# Patient Record
Sex: Female | Born: 1945 | Race: White | Hispanic: No | Marital: Married | Smoking: Former smoker
Health system: Southern US, Community
[De-identification: ages and names within clinical notes are randomized; demographics above are authoritative.]

## PROBLEM LIST (undated history)

## (undated) DIAGNOSIS — J302 Other seasonal allergic rhinitis: Secondary | ICD-10-CM

## (undated) DIAGNOSIS — T7840XA Allergy, unspecified, initial encounter: Secondary | ICD-10-CM

## (undated) DIAGNOSIS — Z86010 Personal history of colonic polyps: Secondary | ICD-10-CM

## (undated) DIAGNOSIS — M21619 Bunion of unspecified foot: Secondary | ICD-10-CM

## (undated) DIAGNOSIS — E785 Hyperlipidemia, unspecified: Secondary | ICD-10-CM

## (undated) DIAGNOSIS — M810 Age-related osteoporosis without current pathological fracture: Secondary | ICD-10-CM

## (undated) DIAGNOSIS — I491 Atrial premature depolarization: Secondary | ICD-10-CM

## (undated) DIAGNOSIS — B029 Zoster without complications: Secondary | ICD-10-CM

## (undated) DIAGNOSIS — F419 Anxiety disorder, unspecified: Secondary | ICD-10-CM

## (undated) DIAGNOSIS — K52831 Collagenous colitis: Secondary | ICD-10-CM

## (undated) DIAGNOSIS — K219 Gastro-esophageal reflux disease without esophagitis: Secondary | ICD-10-CM

## (undated) HISTORY — DX: Gastro-esophageal reflux disease without esophagitis: K21.9

## (undated) HISTORY — DX: Collagenous colitis: K52.831

## (undated) HISTORY — PX: WISDOM TOOTH EXTRACTION: SHX21

## (undated) HISTORY — PX: COLONOSCOPY: SHX174

## (undated) HISTORY — DX: Personal history of colonic polyps: Z86.010

## (undated) HISTORY — DX: Other seasonal allergic rhinitis: J30.2

## (undated) HISTORY — DX: Allergy, unspecified, initial encounter: T78.40XA

## (undated) HISTORY — DX: Atrial premature depolarization: I49.1

## (undated) HISTORY — DX: Hyperlipidemia, unspecified: E78.5

## (undated) HISTORY — DX: Age-related osteoporosis without current pathological fracture: M81.0

## (undated) HISTORY — DX: Anxiety disorder, unspecified: F41.9

## (undated) HISTORY — DX: Zoster without complications: B02.9

## (undated) HISTORY — DX: Bunion of unspecified foot: M21.619

---

## 1997-11-27 ENCOUNTER — Other Ambulatory Visit: Admission: RE | Admit: 1997-11-27 | Discharge: 1997-11-27 | Payer: Self-pay | Admitting: Internal Medicine

## 1998-08-03 ENCOUNTER — Other Ambulatory Visit: Admission: RE | Admit: 1998-08-03 | Discharge: 1998-08-03 | Payer: Self-pay | Admitting: Obstetrics and Gynecology

## 1999-09-23 ENCOUNTER — Other Ambulatory Visit: Admission: RE | Admit: 1999-09-23 | Discharge: 1999-09-23 | Payer: Self-pay | Admitting: Obstetrics and Gynecology

## 2000-11-09 ENCOUNTER — Other Ambulatory Visit: Admission: RE | Admit: 2000-11-09 | Discharge: 2000-11-09 | Payer: Self-pay | Admitting: Obstetrics and Gynecology

## 2002-02-12 ENCOUNTER — Ambulatory Visit (HOSPITAL_COMMUNITY): Admission: RE | Admit: 2002-02-12 | Discharge: 2002-02-12 | Payer: Self-pay | Admitting: Gastroenterology

## 2002-03-06 ENCOUNTER — Other Ambulatory Visit: Admission: RE | Admit: 2002-03-06 | Discharge: 2002-03-06 | Payer: Self-pay | Admitting: Obstetrics and Gynecology

## 2003-03-28 ENCOUNTER — Other Ambulatory Visit: Admission: RE | Admit: 2003-03-28 | Discharge: 2003-03-28 | Payer: Self-pay | Admitting: Obstetrics and Gynecology

## 2004-07-28 ENCOUNTER — Other Ambulatory Visit: Admission: RE | Admit: 2004-07-28 | Discharge: 2004-07-28 | Payer: Self-pay | Admitting: *Deleted

## 2005-09-29 ENCOUNTER — Other Ambulatory Visit: Admission: RE | Admit: 2005-09-29 | Discharge: 2005-09-29 | Payer: Self-pay | Admitting: Obstetrics & Gynecology

## 2006-01-20 ENCOUNTER — Encounter: Admission: RE | Admit: 2006-01-20 | Discharge: 2006-01-20 | Payer: Self-pay | Admitting: Internal Medicine

## 2006-03-27 ENCOUNTER — Ambulatory Visit: Payer: Self-pay | Admitting: Internal Medicine

## 2006-03-29 DIAGNOSIS — Z860100 Personal history of colon polyps, unspecified: Secondary | ICD-10-CM

## 2006-03-29 DIAGNOSIS — K52831 Collagenous colitis: Secondary | ICD-10-CM

## 2006-03-29 HISTORY — DX: Personal history of colonic polyps: Z86.010

## 2006-03-29 HISTORY — DX: Collagenous colitis: K52.831

## 2006-03-29 HISTORY — DX: Personal history of colon polyps, unspecified: Z86.0100

## 2006-04-03 ENCOUNTER — Ambulatory Visit: Payer: Self-pay | Admitting: Internal Medicine

## 2006-04-03 ENCOUNTER — Observation Stay (HOSPITAL_COMMUNITY): Admission: AD | Admit: 2006-04-03 | Discharge: 2006-04-04 | Payer: Self-pay | Admitting: Internal Medicine

## 2006-04-07 ENCOUNTER — Ambulatory Visit: Payer: Self-pay | Admitting: Internal Medicine

## 2006-04-07 ENCOUNTER — Ambulatory Visit: Payer: Self-pay

## 2006-04-10 ENCOUNTER — Ambulatory Visit: Payer: Self-pay | Admitting: Internal Medicine

## 2006-04-11 ENCOUNTER — Ambulatory Visit: Payer: Self-pay

## 2006-04-11 ENCOUNTER — Encounter: Payer: Self-pay | Admitting: Cardiology

## 2006-04-18 ENCOUNTER — Ambulatory Visit: Payer: Self-pay | Admitting: Internal Medicine

## 2006-06-16 ENCOUNTER — Ambulatory Visit: Payer: Self-pay | Admitting: Cardiology

## 2006-11-23 ENCOUNTER — Other Ambulatory Visit: Admission: RE | Admit: 2006-11-23 | Discharge: 2006-11-23 | Payer: Self-pay | Admitting: Obstetrics & Gynecology

## 2007-06-07 ENCOUNTER — Ambulatory Visit: Payer: Self-pay | Admitting: Cardiology

## 2008-02-05 ENCOUNTER — Other Ambulatory Visit: Admission: RE | Admit: 2008-02-05 | Discharge: 2008-02-05 | Payer: Self-pay | Admitting: Obstetrics and Gynecology

## 2008-06-20 ENCOUNTER — Ambulatory Visit: Payer: Self-pay | Admitting: Cardiology

## 2009-04-09 ENCOUNTER — Encounter (INDEPENDENT_AMBULATORY_CARE_PROVIDER_SITE_OTHER): Payer: Self-pay | Admitting: *Deleted

## 2009-05-29 DIAGNOSIS — M810 Age-related osteoporosis without current pathological fracture: Secondary | ICD-10-CM

## 2009-05-29 HISTORY — DX: Age-related osteoporosis without current pathological fracture: M81.0

## 2009-06-01 ENCOUNTER — Encounter: Payer: Self-pay | Admitting: Cardiology

## 2009-06-22 DIAGNOSIS — I471 Supraventricular tachycardia, unspecified: Secondary | ICD-10-CM | POA: Insufficient documentation

## 2009-06-22 DIAGNOSIS — E785 Hyperlipidemia, unspecified: Secondary | ICD-10-CM | POA: Insufficient documentation

## 2009-06-22 DIAGNOSIS — K219 Gastro-esophageal reflux disease without esophagitis: Secondary | ICD-10-CM | POA: Insufficient documentation

## 2009-06-22 DIAGNOSIS — Z8679 Personal history of other diseases of the circulatory system: Secondary | ICD-10-CM | POA: Insufficient documentation

## 2009-06-23 ENCOUNTER — Encounter (INDEPENDENT_AMBULATORY_CARE_PROVIDER_SITE_OTHER): Payer: Self-pay | Admitting: *Deleted

## 2009-06-26 ENCOUNTER — Ambulatory Visit: Payer: Self-pay | Admitting: Cardiology

## 2009-10-06 ENCOUNTER — Telehealth: Payer: Self-pay | Admitting: Cardiology

## 2009-10-27 ENCOUNTER — Telehealth: Payer: Self-pay | Admitting: Cardiology

## 2009-10-28 ENCOUNTER — Encounter: Payer: Self-pay | Admitting: Physician Assistant

## 2009-10-28 ENCOUNTER — Ambulatory Visit: Payer: Self-pay | Admitting: Cardiology

## 2009-10-28 DIAGNOSIS — R079 Chest pain, unspecified: Secondary | ICD-10-CM | POA: Insufficient documentation

## 2009-11-03 ENCOUNTER — Telehealth (INDEPENDENT_AMBULATORY_CARE_PROVIDER_SITE_OTHER): Payer: Self-pay | Admitting: *Deleted

## 2009-11-04 ENCOUNTER — Ambulatory Visit: Payer: Self-pay

## 2009-11-04 ENCOUNTER — Encounter (HOSPITAL_COMMUNITY): Admission: RE | Admit: 2009-11-04 | Discharge: 2009-12-29 | Payer: Self-pay | Admitting: Cardiology

## 2009-11-04 ENCOUNTER — Ambulatory Visit: Payer: Self-pay | Admitting: Internal Medicine

## 2009-11-05 ENCOUNTER — Telehealth: Payer: Self-pay | Admitting: Cardiology

## 2009-11-10 ENCOUNTER — Ambulatory Visit: Payer: Self-pay | Admitting: Cardiology

## 2010-06-09 ENCOUNTER — Encounter: Payer: Self-pay | Admitting: Cardiology

## 2010-08-29 HISTORY — PX: FACIAL COSMETIC SURGERY: SHX629

## 2010-09-28 NOTE — Progress Notes (Signed)
Summary: rx metoprolol tart   Phone Note Refill Request Message from:  Patient on October 06, 2009 9:13 AM  Refills Requested: Medication #1:  METOPROLOL TARTRATE 25 MG TABS 1 tab two times a day   [facility] [address]    Method Requested: Telephone to Pharmacy Initial call taken by: Migdalia Dk,  October 06, 2009 9:14 AM Caller: Patient    Prescriptions: METOPROLOL TARTRATE 25 MG TABS (METOPROLOL TARTRATE) 1 tab two times a day  #60 x 11   Entered by:   Danielle Rankin, CMA   Authorized by:   Gaylord Shih, MD, [facility]   Signed by:   Danielle Rankin, CMA on 10/06/2009   Method used:   [facility]        [facility]  [phone]* (retail)       [address] [place], [place]  [address]       Ph: [phone] or [phone]       Fax: [phone]   RxID:   [id number]

## 2010-09-28 NOTE — Assessment & Plan Note (Signed)
Summary: Cardiology Nuclear Study  Nuclear Med Background Indications for Stress Test: Evaluation for Ischemia   History: Echo, Myocardial Perfusion Study  History Comments: 8/07 ZOX:WRUEAV, EF=71%; 8/07 Echo:normal LVF; h/o SVT  Symptoms: Chest Pain, Palpitations  Symptoms Comments: Last episode of WU:JWJX am   Nuclear Pre-Procedure Cardiac Risk Factors: Family History - CAD, History of Smoking, Lipids Caffeine/Decaff Intake: None NPO After: 10:00 PM Lungs: Clear IV 0.9% NS with Angio Cath: 22g     IV Site: (R) Hand IV Started by: Irean Hong RN Chest Size (in) 38     Cup Size B     Height (in): 64 Weight (lb): 134 BMI: 23.08 Tech Comments: Held metoprolol x 24 hrs.  Nuclear Med Study 1 or 2 day study:  1 day     Stress Test Type:  Stress Reading MD:  Arvilla Meres, MD     Referring MD:  Valera Castle, MD Resting Radionuclide:  Technetium 44m Tetrofosmin     Resting Radionuclide Dose:  11.0 mCi  Stress Radionuclide:  Technetium 36m Tetrofosmin     Stress Radionuclide Dose:  33.0 mCi   Stress Protocol Exercise Time (min):  10:15 min     Max HR:  155 bpm     Predicted Max HR:  159 bpm  Max Systolic BP: 166 mm Hg     Percent Max HR:  97.48 %     METS: 11.6 Rate Pressure Product:  91478    Stress Test Technologist:  Rea College CMA-N     Nuclear Technologist:  Burna Mortimer Deal RT-N  Rest Procedure  Myocardial perfusion imaging was performed at rest 45 minutes following the intravenous administration of Myoview Technetium 5m Tetrofosmin.  Stress Procedure  The patient exercised for 10:15.  The patient stopped due to fatigue and denied any chest pain.  There were no significant ST-T wave changes, only occasional PVC's and rare PAC's.  Myoview was injected at peak exercise and myocardial perfusion imaging was performed after a brief delay.  QPS Raw Data Images:  Normal; no motion artifact; normal heart/lung ratio. Stress Images:  There is normal uptake in all areas. Rest  Images:  Normal homogeneous uptake in all areas of the myocardium. Subtraction (SDS):  Normal Transient Ischemic Dilatation:  .92  (Normal <1.22)  Lung/Heart Ratio:  .28  (Normal <0.45)  Quantitative Gated Spect Images QGS EDV:  76 ml QGS ESV:  24 ml QGS EF:  69 % QGS cine images:  Normal  Findings Normal nuclear study      Overall Impression  Exercise Capacity: Good exercise capacity. BP Response: Normal blood pressure response. Clinical Symptoms: No chest pain ECG Impression: Insignificant upsloping ST segment depression. Overall Impression: Normal stress nuclear study.  Appended Document: Cardiology Nuclear Study Reassurance. Good study.  Appended Document: Cardiology Nuclear Study LMTCB./CY  Appended Document: Cardiology Nuclear Study PT AWARE./CY

## 2010-09-28 NOTE — Assessment & Plan Note (Signed)
Summary: ROV  C/O CHEST PAIN/CY    Visit Type:  chest pain Primary Provider:  [name]  CC:  chest pain/BP up about.  History of Present Illness: This is a 65 year old white female patient, who has a history of SVT and palpitations. She presents today complaining of chest pain. She said she has been taking care of her. 109-year-old and 27-year-old grandchildren for the past week and has done a lot of heavy lifting with the 65-year-old. She, said she's developed the sharp chest pain, associated with a fluttering in the center of her chest and her blood pressure had gone up. She does walk daily and has no symptoms with exertion. She says the fluttering does not last and she is not having racing heart. Cardiac risk factors include hyperlipidemia, family history coronary artery disease, remote smoker. She is nondiabetic and has never been diagnosed with hypertension.  Problems Prior to Update: 1)  Chest Pain Unspecified  (ICD-786.50) 2)  Murmur  (ICD-785.2) 3)  Hyperlipidemia  (ICD-272.4) 4)  Palpitations, Hx of  (ICD-V12.50) 5)  Premature Ventricular Contractions/ Hx of Symptomatic  (ICD-427.69) 6)  Premature Atrial Contractions/ Hx of Symptomatic  (ICD-427.61) 7)  Supraventricular Tachycardia, Hx of  (ICD-V12.59) 8)  Gerd  (ICD-530.81)  Current Medications (verified): 1)  Metoprolol Tartrate 25 Mg Tabs (Metoprolol Tartrate) .Marland Kitchen.. 1 Tab Two Times A Day 2)  Simvastatin 20 Mg Tabs (Simvastatin) .Marland Kitchen.. 1 Tab Once Daily 3)  Aspirin 81 Mg Tbec (Aspirin) .... Take One Tablet By Mouth Daily 4)  Lexapro 10 Mg Tabs (Escitalopram Oxalate) .Marland Kitchen.. 1 Tab Once Daily 5)  Nexium 40 Mg Cpdr (Esomeprazole Magnesium) .Marland Kitchen.. 1 Tab Once Daily 6)  Calcium 500 Mg Tabs (Calcium Carbonate) .Marland Kitchen.. 1 Tab Two Times A Day 7)  Vitamin D 2000 Unit Tabs (Cholecalciferol) .Marland Kitchen.. 1 Tab Once Daily 8)  Multivitamins   Tabs (Multiple Vitamin) .Marland Kitchen.. 1 Tab Once Daily 9)  Vagifem 10 Mcg Tabs (Estradiol) .Marland Kitchen.. 1 Tab Bi  Weekly  Allergies: 1)  ! Epinephrine  Past History:  Past Medical History: Last updated: 06/22/2009 HYPERLIPIDEMIA (ICD-272.4) PALPITATIONS, HX OF (ICD-V12.50) PREMATURE VENTRICULAR CONTRACTIONS/ HX OF SYMPTOMATIC (ICD-427.69) PREMATURE ATRIAL CONTRACTIONS/ HX OF SYMPTOMATIC (ICD-427.61) SUPRAVENTRICULAR TACHYCARDIA, HX OF (ICD-V12.59) GERD (ICD-530.81)  Family History: Reviewed history from 06/22/2009 and no changes required. Her mother is alive at the age of 40 with a history of femoral bypass and hip and knee replacement.  Her father died at the age of 77 with a history of lung cancer, COPD and atrial fibrillation.  She has one sister, two brothers who are alive and well.  Grandfather & cousin died MI at 25.  Review of Systems       see history of present illness  Vital Signs:  Patient profile:   65 year old female Height:      64 inches Weight:      136 pounds Pulse rate:   65 / minute BP sitting:   110 / 65  (left arm) Cuff size:   large  Vitals Entered By: Oswald Hillock (October 28, 2009 8:40 AM)  Physical Exam  General:   Well-nournished, in no acute distress. Neck: No JVD, HJR, Bruit, or thyroid enlargement Lungs: No tachypnea, clear without wheezing, rales, or rhonchi Cardiovascular: RRR, PMI not displaced, heart sounds normal, no murmurs, gallops, bruit, thrill, or heave. Abdomen: BS normal. Soft without organomegaly, masses, lesions or tenderness. Extremities: without cyanosis, clubbing or edema. Good distal pulses bilateral SKin: Warm, no lesions or rashes  Musculoskeletal: No deformities Neuro: no focal signs    EKG  Procedure date:  10/28/2009  Findings:      normal sinus rhythm, nonspecific ST-T wave changes. No acute change  Impression & Recommendations:  Problem # 1:  CHEST PAIN UNSPECIFIED (ICD-786.50) Patient has recent history of chest pain after taking care of her 2 young grandchildren associated with fluttering. She does have  cardiac risk factors and has not had a stress test in over 5 years. We will order a stress Myoview. Her updated medication list for this problem includes:    Metoprolol Tartrate 25 Mg Tabs (Metoprolol tartrate) .Marland Kitchen... 1 tab two times a day    Aspirin 81 Mg Tbec (Aspirin) .Marland Kitchen... Take one tablet by mouth daily  Orders: Nuclear Stress Test (Nuc Stress Test)  Problem # 2:  PALPITATIONS, HX OF (ICD-V12.50) Patient has a history of palpitations and SVT. The fluttering. She is having now is not severe and the metoprolol has helped greatly. Orders: EKG w/ Interpretation (93000)  Problem # 3:  HYPERLIPIDEMIA (ICD-272.4) treated Her updated medication list for this problem includes:    Simvastatin 20 Mg Tabs (Simvastatin) .Marland Kitchen... 1 tab once daily  Patient Instructions: 1)  Your physician recommends that you schedule a follow-up appointment in: Pt. has an appointment with Dr. Daleen Squibb on March 15th at 11:15 AM 2)  Your physician has requested that you have an exercise stress myoview.  For further information please visit [email or web address]  Please follow instruction sheet, as given.

## 2010-09-28 NOTE — Progress Notes (Signed)
Summary: left sided pain   Phone Note Call from Patient Call back at Home Phone [phone] Call back at [phone]   Caller: Patient Reason for Call: Talk to Nurse Details for Reason: Per pt caling, c/o left sided pain come / goes .  Initial call taken by: Lorne Skeens,  October 27, 2009 9:36 AM  Follow-up for Phone Call        PT C/O LEFT SIDE CHEST PAIN  STARTING THIS AM AT 8:30 COMES AND GOES PER PT  WENT AHEAD TO [facility] CLASS WITH NO PROBLEMS PER PT TAKES NEXIUM DAILY DID TAKE TUMS AS WELL WITH SOME IMPROVEMENT  APPT WITH PA SCHEDULED FOR TOMMORROW 10/28/09 AT 8:30. PT INSTRUCTED IF S/S WORSEN TO SEEK TX  IN ER VERBALZIED UNDERSTANDING. Follow-up by: Scherrie Bateman, LPN,  October 27, 2009 2:10 PM  Additional Follow-up for Phone Call Additional follow up Details #1::        OK Additional Follow-up by: Gaylord Shih, MD, [facility],  October 29, 2009 10:33 AM

## 2010-09-28 NOTE — Letter (Signed)
Summary: Lendon Colonel   Imported By: Marylou Mccoy 06/29/2010 15:15:35  _____________________________________________________________________  External Attachment:    Type:   Image     Comment:   External Document

## 2010-09-28 NOTE — Progress Notes (Signed)
Summary: TEST RESULTS   Phone Note Call from Patient Call back at Home Phone [phone]   Caller: Patient Summary of Call: PT RETURNING CALL ABOUT STRESS TEST Initial call taken by: Judie Grieve,  November 05, 2009 4:51 PM  Follow-up for Phone Call        PT AWARE OF TEST RESULTS. Follow-up by: Scherrie Bateman, LPN,  November 06, 2009 8:40 AM

## 2010-09-28 NOTE — Assessment & Plan Note (Signed)
Summary: rov per pt call/lg    Visit Type:  rov Primary Provider:  Theressa [name]  CC:  no cardiac complaints today.  History of Present Illness: [name] returns for evaluation and her chest pain.  It is resolved. She had a stress nuclear study on March 9 showed excellent exercise tolerance, ejection fraction of 69%, and normal perfusion.  You this with her today.  She has very few palpitations on the beta blocker.  Current Medications (verified): 1)  Metoprolol Tartrate 25 Mg Tabs (Metoprolol Tartrate) .Marland Kitchen.. 1 Tab Two Times A Day 2)  Simvastatin 20 Mg Tabs (Simvastatin) .Marland Kitchen.. 1 Tab Once Daily 3)  Aspirin 81 Mg Tbec (Aspirin) .... Take One Tablet By Mouth Daily 4)  Lexapro 10 Mg Tabs (Escitalopram Oxalate) .Marland Kitchen.. 1 Tab Once Daily 5)  Nexium 40 Mg Cpdr (Esomeprazole Magnesium) .Marland Kitchen.. 1 Tab Once Daily 6)  Calcium 500 Mg Tabs (Calcium Carbonate) .Marland Kitchen.. 1 Tab Two Times A Day 7)  Vitamin D 2000 Unit Tabs (Cholecalciferol) .Marland Kitchen.. 1 Tab Once Daily 8)  Multivitamins   Tabs (Multiple Vitamin) .Marland Kitchen.. 1 Tab Once Daily 9)  Vagifem 10 Mcg Tabs (Estradiol) .Marland Kitchen.. 1 Tab Bi Weekly  Allergies: 1)  ! Epinephrine  Past History:  Past Medical History: Last updated: 06/22/2009 HYPERLIPIDEMIA (ICD-272.4) PALPITATIONS, HX OF (ICD-V12.50) PREMATURE VENTRICULAR CONTRACTIONS/ HX OF SYMPTOMATIC (ICD-427.69) PREMATURE ATRIAL CONTRACTIONS/ HX OF SYMPTOMATIC (ICD-427.61) SUPRAVENTRICULAR TACHYCARDIA, HX OF (ICD-V12.59) GERD (ICD-530.81)  Family History: Last updated: 10/28/2009 Her mother is alive at the age of 64 with a history of femoral bypass and hip and knee replacement.  Her father died at the age of 16 with a history of lung cancer, COPD and atrial fibrillation.  She has one sister, two brothers who are alive and well.  Grandfather & cousin died MI at 44.  Social History: Last updated: 06/22/2009 She resides in [place] with her husband.  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  She has  not smoked for 35 years.  Prior to that, she smoked a pack per day for five years.  She drinks two glasses of red wine a day.  She denies any illicit  drugs or herbal medication.  She tries to maintain a low-fat diet.  She  states that she will walk approximately four times per week at a mild and a  half without difficulty.  Review of Systems       negative other than history of present illness  Vital Signs:  Patient profile:   65 year old female Height:      64 inches Weight:      137 pounds BMI:     23.60 Pulse rate:   68 / minute Pulse rhythm:   regular BP sitting:   132 / 80  (left arm) Cuff size:   large  Vitals Entered By: Danielle Rankin, CMA (November 10, 2009 11:24 AM)  Physical Exam  General:  Well developed, well nourished, in no acute distress. Head:  normocephalic and atraumatic Eyes:  PERRLA/EOM intact; conjunctiva and lids normal. Neck:  Neck supple, no JVD. No masses, thyromegaly or abnormal cervical nodes. Chest Maverik Foot:  no deformities or breast masses noted Lungs:  Clear bilaterally to auscultation and percussion. Heart:  normal S1-S2, regular rate and rhythm, soft systolic murmur at the left lower sternal border. S2 splits Msk:  Back normal, normal gait. Muscle strength and tone normal. Pulses:  pulses normal in all 4 extremities Extremities:  No clubbing or cyanosis. Neurologic:  Alert and oriented x 3. Skin:  Intact without lesions or rashes. Psych:  Normal affect.   Impression & Recommendations:  Problem # 1:  CHEST PAIN UNSPECIFIED (ICD-786.50) Assessment Improved  Her updated medication list for this problem includes:    Metoprolol Tartrate 25 Mg Tabs (Metoprolol tartrate) .Marland Kitchen... 1 tab two times a day    Aspirin 81 Mg Tbec (Aspirin) .Marland Kitchen... Take one tablet by mouth daily  Problem # 2:  MURMUR (ICD-785.2) Assessment: Unchanged  Her updated medication list for this problem includes:    Metoprolol Tartrate 25 Mg Tabs  (Metoprolol tartrate) .Marland Kitchen... 1 tab two times a day  Problem # 3:  PALPITATIONS, HX OF (ICD-V12.50) Assessment: Improved  Clinical Reports Reviewed:  Nuclear Study:  11/04/2009:  Excerise capacity: Good exercise capacity  Blood Pressure response: Normal blood pressure response  Clinical symptoms: No chest pain  ECG impression: Insignificant upsloping ST segment depression  Overall impression: Normal stress nulcear study.  Arvilla Meres, MD, [facility]   04/07/2006:  Excerise capacity: Good exercise capacity  Blood Pressure response: Normal blood pressure repsonse  Clinical symptoms: There is dyspnea. No CP  ECG impression: Insignificant upsloping ST segment depression  Overall impression: Normal stress nuclear study.  Arvilla Meres, MD   Patient Instructions: 1)  Your physician recommends that you schedule a follow-up appointment in: YEAR WITH DR Charmagne Buhl 2)  Your physician recommends that you continue on your current medications as directed. Please refer to the Current Medication list given to you today.

## 2010-09-28 NOTE — Progress Notes (Signed)
Summary: Nuclear Pre-Procedure  Phone Note Outgoing Call Call back at [facility] Phone [phone]   Call placed by: Stanton Kidney, EMT-P,  November 03, 2009 3:17 PM Action Taken: Phone Call Completed Summary of Call: Left message with information on Myoview Information Sheet (see scanned document for details).     Nuclear Med Background Indications for Stress Test: Evaluation for Ischemia   History: Echo, GXT  History Comments: >5 years GXT 8/07 Echo: NL  Symptoms: Chest Pain, Palpitations    Nuclear Pre-Procedure Cardiac Risk Factors: Family History - CAD, History of Smoking, Lipids Height (in): 64

## 2010-12-10 ENCOUNTER — Encounter: Payer: Self-pay | Admitting: Cardiology

## 2010-12-13 ENCOUNTER — Encounter: Payer: Self-pay | Admitting: Cardiology

## 2010-12-13 ENCOUNTER — Ambulatory Visit (INDEPENDENT_AMBULATORY_CARE_PROVIDER_SITE_OTHER): Payer: BC Managed Care – PPO | Admitting: Cardiology

## 2010-12-13 DIAGNOSIS — Z8679 Personal history of other diseases of the circulatory system: Secondary | ICD-10-CM

## 2010-12-13 DIAGNOSIS — R079 Chest pain, unspecified: Secondary | ICD-10-CM

## 2010-12-13 DIAGNOSIS — R011 Cardiac murmur, unspecified: Secondary | ICD-10-CM

## 2010-12-13 NOTE — Progress Notes (Signed)
   Patient ID: [name], female    DOB: [birth year], 65 y.o.   MRN: [id number]  HPI  Mrs Brashier returns for E and M of her palpitations. These are well controlled with low dose metoprolol. She has had no further CP. Stress Myoview last year was unremarkable except for a few PVC's and PAC's.  EKG shows NSR with IRBBB and LAD.   Review of Systems  All other systems reviewed and are negative.      Physical Exam  Nursing note and vitals reviewed. Constitutional: She appears well-developed and well-nourished. No distress.  HENT:  Head: Normocephalic and atraumatic.  Eyes: EOM are normal. Pupils are equal, round, and reactive to light.  Neck: Normal range of motion. Neck supple. No JVD present. No tracheal deviation present. No thyromegaly present.  Cardiovascular: Normal rate, regular rhythm, normal heart sounds and intact distal pulses.  Exam reveals no gallop.        1/6 SEM  Pulmonary/Chest: Effort normal and breath sounds normal.  Abdominal: Soft. Bowel sounds are normal.  Musculoskeletal: Normal range of motion. She exhibits no edema.  Neurological: She is alert.  Skin: Skin is dry.  Psychiatric: She has a normal mood and affect.

## 2010-12-13 NOTE — Patient Instructions (Signed)
Your physician recommends that you schedule a follow-up appointment in: 1 year with Dr. Wall  

## 2010-12-13 NOTE — Assessment & Plan Note (Signed)
Stable

## 2010-12-13 NOTE — Assessment & Plan Note (Signed)
Resolved

## 2010-12-22 ENCOUNTER — Telehealth: Payer: Self-pay | Admitting: Internal Medicine

## 2010-12-22 NOTE — Telephone Encounter (Signed)
I returned a call to the patient she reports that she has had 2 episodes of bright red blood on the stool.  She is asking if this is cause for concern.  I advised there that possibly from a hemorrhoid, but she should set up an appt to have this evaluated. She is due for a colon 09/2011.  Patient indicated she was at a place where she could not speak freely and she declined to schedule an appt with me this afternoon.  She indicated she would call back tomorrow and set up an appt with Dr Leone Payor.

## 2011-01-11 NOTE — Assessment & Plan Note (Signed)
[facility]                            CARDIOLOGY OFFICE NOTE   NAME:LONGHuyen, Perazzo                         MRN:          [id number]  DATE:06/07/2007                            DOB:          [birth year]    Ms. Dobberstein comes in today for management of the following issues:  1. History of SVT.  2. History of symptomatic PACs and PVCs. These have been controlled      well with beta blockade.  3. History of palpitations.   She was recently switched from Simvastatin to Lipitor because of muscle  aches by Dr. Benjaman Kindler.   An echocardiogram in the past has shown some mild calcification of the  aortic valve.   She is totally asymptomatic. She feels remarkably well on the Metoprolol  extended release 50 mg daily. Her other medicines: Lipitor 20 mg daily,  and she is on aspirin 81 mg daily.   PHYSICAL EXAMINATION:  VITAL SIGNS:  Blood pressure 120/84, pulse 61 and  regular, weight 137 pounds.  HEENT:  Normocephalic atraumatic. PERRLA. Extraocular muscles are  intact. Sclerae clear. Facial symmetry is normal. Dentition is  satisfactory.  NECK:  Carotid upstrokes are equal bilaterally without bruit. There is  no JVD. Thyroid is not enlarged. Trachea is midline.  LUNGS:  Clear.  HEART:  Regular rate and rhythm with no murmurs, rubs, or gallops.  ABDOMEN:  Soft with good bowel sounds. No midline bruit.  EXTREMITIES:  No cyanosis, clubbing, or edema. Pulses are intact.  NEUROLOGIC:  Intact.   Her electrocardiogram shows sinus rhythm with an RSR prime in V1 and V2,  which is stable.   ASSESSMENT AND PLAN:  Ms. Sancho is doing well. The low dose Metoprolol  has controlled her symptomatic premature beats incredibly well. We will  make no changes in her program. Note that it has not caused her to feel  any difference in her mood swings and that Lexapro helps her feel good.     Thomas C. Daleen Squibb, MD, [facility]  Electronically Signed    TCW/MedQ  DD: 06/07/2007  DT:  06/08/2007  Job #: [id number]

## 2011-01-11 NOTE — Assessment & Plan Note (Signed)
[facility]                            CARDIOLOGY OFFICE NOTE   NAME:LONGJenell, Dobransky                         MRN:          [id number]  DATE:06/20/2008                            DOB:          1946-01-09    [name] comes back today for followup of her history of the following  issues:  1. History of SVT.  It has been quiescent with conservative therapy      with beta-blockade.  2. History of symptomatic PACs and PVCs.  These have been associated      with palpitations, which are now under control.   She takes metoprolol tartrate 25 b.i.d.  This was switched from  metoprolol succinate when the supply ran short.  She had been on 50 mg  daily prior to that.   MEDICATIONS:  1. She also takes simvastatin 20 mg a day.  2. Calcium.  3. Vitamin D.  4. Multivitamin.  5. Baby aspirin 81 mg a day.  6. Lexapro 10 mg a day.  7. Nexium 40 mg a day.   She is followed by Dr. Benjaman Kindler.   She denies any palpitations, chest discomfort, and ischemic symptoms.  She is extremely acting, exercising, is in curves, and does yoga.   PHYSICAL EXAMINATION:  VITAL SIGNS:  Her blood pressure is 100/68, pulse  shows sinus bradycardia rate of 57 beats per minute.  Her weight is 139.  HEENT:  Normal.  NECK:  Carotid upstrokes were equal bilateral without bruits.  No JVD.  Thyroid is not enlarged.  Trachea is midline.  LUNGS:  Clear to auscultation and percussion.  HEART:  Reveals regular rate and rhythm.  ABDOMEN:  Soft, good bowel sounds.  There is no hepatomegaly.  EXTREMITIES:  No cyanosis, clubbing, or edema.  Pulses are intact.  NEUROLOGIC:  Intact.   EKG shows sinus brady with an RSR prime in V1 and V2, which is old.  Her  PR and QTC are normal.   [name] is doing well.  I have made no changes in her medical program.  I will see her back in a year.     Thomas C. Daleen Squibb, MD, [facility]  Electronically Signed    TCW/MedQ  DD: 06/20/2008  DT: 06/21/2008  Job #:  [id number]   cc:   [name], M.D.

## 2011-01-14 NOTE — Assessment & Plan Note (Signed)
[facility]                           GASTROENTEROLOGY OFFICE NOTE   NAME:[name], [name]                         MRN:          [id number]  DATE:04/10/2006                            DOB:          [birth year]    CHIEF COMPLAINT:  Diarrhea, abdominal pain.   HISTORY:  [age]-year-old white woman with complaints since April of  intermittent watery urgent diarrhea.  She was seen by her primary care  physician, Dr. Theressa [name], who thought she might have diverticulitis.  A  CT scan at [facility] demonstrated calcified cholelithiasis,  probable 1.4-cm right fundal uterine fibroid, but no other findings.  She  was given two rounds of antibiotics, one of which included Septra DS.  These  really did not stop her symptoms.  She has some vague right lower quadrant  abdominal pain, as well.  The bowel habit changes have mainly been confined  to the morning.  She takes Nexium which controls heartburn and indigestion  quite well for the past five years.  She denies fever.  There has been no  bleeding.  She is on simvastatin and progesterone which are new, but it is  not clear that the symptoms are related to that.  She has not lost any  weight.  There are occasional night sweats.  She still has some menstrual  periods.   PAST MEDICAL HISTORY:  Dyslipidemia, anxiety, reflux disease.   MEDICATIONS:  1. Nexium 40 mg daily.  2. Lexapro 5 mg daily.  3. Simvastatin one each day.  4. Baby aspirin a day.  5. Multivitamin daily.  6. Calcium two each day.  7. Progesterone 10 days out of the month.   DRUG ALLERGIES:  She has no allergies.  Epinephrine makes her heart pound.   FAMILY HISTORY:  Father had lung cancer with metastases to the liver.  She  had two great aunts, an aunt, a second cousin, and a great uncle with colon  cancer.   SOCIAL HISTORY:  She is married.  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  Two daughters, one of  whom is a personal friend of  mine, and I am personally aquatinted with the patient.  Red wine twice a day  on average.  No tobacco or other alcohol.   REVIEW OF SYSTEMS:  As mentioned above.  All other systems are negative  except for things mentioned in my history and physical.   PHYSICAL EXAMINATION:  GENERAL:  Reveals a pleasant, well-developed white  woman looking somewhat younger than stated age.  VITAL SIGNS:  Height 5 feet 4 inches, weight 140 pounds.  Blood pressure  146/78.  Pulse 76.  HEENT:  Eyes:  ENT normal, posterior pharynx.  NECK:  Supple with no mass.  CHEST:  Clear.  HEART:  S1 and S2.  No rubs, murmurs, or gallops.  ABDOMEN:  Soft and nontender without organomegaly or mass.  EXTREMITIES:  Show no edema.  SKIN:  No rash.   NOTE:  The patient had a colonoscopy by Dr. Tasia [name] about four years  ago,  on February 12, 2002, and this was entirely normal.   ASSESSMENT:  Right lower quadrant pain, diarrhea symptoms, new since April,  etiology not clear.  Prevoius colonoscopy unrevealing.  Probable uterine  fibroid and gallstones which are likely not related to this problem.  She  has failed to respond to two rounds of antibiotics.  Irritable bowel  syndrome is certainly possible.  Inflammatory bowel disease, possibility of  gastrointestinal neoplasia of the colon certainly exists, as well.   PLAN:  Colonoscopy.  Consider random biopsies.  Terminal ileal intubation  will be attempted, as well.   Laboratory studies were ordered on July 30, and have returned with sodium  146, CO2 33, otherwise normal CBC and BMET.  Her TSH was normal at 1.47.   Risks, benefits, and indications of the procedure explained to the patient.  She understands and agrees to proceed.   I appreciate the opportunity to care for this patient.   ADDENDUM:  The patient explained (after her colonoscopy) today that she did  have some abnormal cells on an endometrial biopsy at [facility].  She thinks she had an ultrasound at that time, though that was probably last  year.  She may need a pelvic ultrasound to look at the uterus and other  gynecologic organs as part of her followup/workup of these symptoms she is  having.  Await colonoscopy biopsy findings and I will discuss with her.                                  [name] Boop, MD, [facility]  Job# [id number]  CEG/MedQ  DD:  04/10/2006  DT:  04/11/2006  Job #:  [id number]   cc:   [name] Millard, MD

## 2011-01-14 NOTE — Assessment & Plan Note (Signed)
[facility]                           GASTROENTEROLOGY OFFICE NOTE   NAME:LONGDanaja, Lasota                         MRN:          [id number]  DATE:04/10/2006                            DOB:          22-May-1946    ADDENDUM:  The patient explained (after her colonoscopy) today that she did  have some abnormal cells on an endometrial biopsy at [facility].  She thinks she had an ultrasound at that time, though that was probably last  year.  She may need a pelvic ultrasound to look at the uterus and other  gynecologic organs as part of her followup/workup of these symptoms she is  having.  Await colonoscopy biopsy findings and I will discuss with her.                                   Iva Boop, MD, [facility]   CEG/MedQ  DD:  04/10/2006  DT:  04/11/2006  Job #:  [id number]   cc:   [name], MD

## 2011-01-14 NOTE — Assessment & Plan Note (Signed)
[facility]                              CARDIOLOGY OFFICE NOTE   NAME:[name], [name] WIENKE                         MRN:          [id number]  DATE:04/18/2006                            DOB:          08-22-1946    This is a 64 year old married white female patient who was referred by Dr.  [name] and admitted by Dr. Gala Romney for shortness of breath and  palpitations.  Cardiac enzymes were negative and she was discharged home for  an outpatient cardiac workup including echo, Holter and stress Cardiolite.  Stress Cardiolite was normal.  2-D echo showed normal LV function, no wall  motion abnormalities.  Aortic valve was minimally calcified and estimated  right ventricular systolic pressure was the upper limits of normal.  She did  have Holter monitor that showed multiple PACs, PVCs with a brief front of  SVT.  Fastest rate 147 corresponding to her symptoms.  Dr. Gala Romney called  in Lopressor but patient was out of town and has not started this.  She  continues to have palpitations and brief episodes of shortness of breath and  feels anxious as well.   CURRENT MEDICATIONS:  1. Nexium 40 mg daily.  2. Lexapro 10 mg daily.  3. Simvastatin 40 mg daily.  4. Baby aspirin daily.  5. Multivitamin daily.  6. Calcium two daily.  7. Progesterone 10 mg 10 days a month.   PHYSICAL EXAMINATION:  GENERAL:  This is a pleasant somewhat anxious 60-year-  old white female in no acute distress.  VITAL SIGNS:  Blood pressure 118/75, pulse 68, weight 141.  NECK:  Without JVD, HJR, bruit, or thyroid enlargement.  LUNGS:  Clear posterior and lateral.  HEART:  Regular rate and rhythm at 70 beats per minute.  Normal S1 and S2.  No murmur, rub, bruits, thrill or heave noted.  ABDOMEN:  Soft, no organomegaly, masses, lesions or abnormal tenderness.  EXTREMITIES:  Without cyanosis, clubbing or edema.  She has good distal  pulses.   EKG:  Normal sinus rhythm.  Poor R-wave  progression.  Incomplete right  bundle branch block, no acute change.   IMPRESSION:  1. Palpitations associated with premature ventricular contractions,      premature atrial contractions, and brief run of supraventricular      tachycardias on Holter monitor.  2. History of depressed mood.  3. Gastroesophageal reflux disease.  4. Hypercholesterolemia.   PLAN:  At this time the patient prefers to take once a day dose so I have  given her a prescription for Toprol XL 50 mg daily.  Patient's husband sees  Dr. Daleen Squibb and asks that he become her regular cardiologist as well.  We will  have her see him in two months to followup on how she is doing on a beta  blocker and establish with him.                                  [name], PA-C  Luis Abed, MD, [facility]   ML/MedQ  DD:  04/18/2006  DT:  04/18/2006  Job #:  [id number]   cc:   Thomas C. Daleen Squibb, MD, [facility]  [name], MD

## 2011-01-14 NOTE — Discharge Summary (Signed)
NAME:  DANNY, ZIMNY                  ACCOUNT NO.:  [id number]   MEDICAL RECORD NO.:  [id number]          PATIENT TYPE:  OBV   LOCATION:  3736                         FACILITY:  [facility]   PHYSICIAN:  [name], M.D.    DATE OF BIRTH:  May 28, 1946   DATE OF ADMISSION:  04/03/2006  DATE OF DISCHARGE:  04/04/2006                                 DISCHARGE SUMMARY   ADMISSION DIAGNOSIS:  Shortness of breath, ? etiology.   DISCHARGE DIAGNOSES:  1. Shortness of breath, ? etiology.  2. Palpitation.  3. History of depressed mood.  4. Gastroesophageal reflux disease.  5. Hypercholesterolemia.   The patient is a 65 year old white female who was brought in for evaluation  for shortness of breath.  She was a somewhat vague historian, but described  at least 3 days of inability to take a deep breath.  She was admitted.   On the basis of serial enzymes and EKGs, a myocardial infarction was ruled  out.  D-Dimer was negative.  CBC and CMET were also normal.  She was seen in  consultation by Dr. Gala Romney, who felt the patient was appropriate for  outpatient workup and the patient was discharged in improved condition.  She  will have an echocardiogram, Holter monitor, and stress Cardiolite as workup  for chest discomfort.   DISCHARGE MEDICATIONS:  1. Lexapro 10 mg daily.  2. Nexium 40 mg daily.  3. Zocor 40 mg nightly.  4. Aspirin 81 mg daily.  5. Calcium daily, multivitamin daily.   FOLLOWUP:  With me p.r.n., with Dr. Gala Romney as scheduled   ACTIVITY:  As tolerated.   DIET:  No added salt.      [name], M.D.  Electronically Signed    JO/MEDQ  D:  04/11/2006  T:  04/11/2006  Job:  [id number]   cc:   Bevelyn Buckles. Bensimhon, MD

## 2011-01-14 NOTE — Assessment & Plan Note (Signed)
[facility]                           GASTROENTEROLOGY OFFICE NOTE   NAME:[name], [name] HARTNEY                         MRN:          [id number]  DATE:04/23/2006                            DOB:          [birth year]    GASTROENTEROLOGY NOTE:  [name] colonoscopy biopsies came back.  She had  a 5 mm tubular adenoma in the rectum.  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  I  explained this to her.  She is only having some episodic diarrhea at this  time and Imodium AD works well.  Mostly if she has as few glasses of wine  things seem to be triggered.   At this point she is going to use the intermittent Imodium.  We plan for a  recall colonoscopy in 5 years due to the polyp.  Should she develop  worsening symptoms, I think a course of Entocort EC for six weeks would make  sense.                                   Iva Boop, MD, [facility]   CEG/MedQ  DD:  04/23/2006  DT:  04/24/2006  Job #:  [id number]   cc:   [name], MD

## 2011-01-14 NOTE — Assessment & Plan Note (Signed)
[facility]                              CARDIOLOGY OFFICE NOTE   NAME:Pauling, ALAYNNA KERWOOD                         MRN:          [id number]  DATE:06/16/2006                            DOB:          Oct 19, 1945    REASON FOR VISIT:  Ms. Absher comes in today to establish with me as her  cardiologist.  I have seen her husband in the past.   HISTORY:  Please see the note on 04/18/06 by Wende Bushy, [facility].  She has a  history of PACs, PVCs and short run of SVT caught on a Holter monitor.  This  is associated with palpitations.  She was placed on metoprolol extended  release 50 mg a day her last visit.  She has had remarkable symptomatic  improvement.   Her other problems are mild calcification of the aortic valve by  echocardiogram, hyperlipidemia followed by Dr. Earl Gala and GERD.   PHYSICAL EXAMINATION:  VITAL SIGNS:  On exam today, her blood pressure is  120/72, pulse 70 and regular, weight 136.  GENERAL:  She is in no acute distress.  NECK:  Carotid upstrokes are equal bilaterally without bruits.  No JVD.  Thyroid not enlarged.  LUNGS:  Clear.  HEART:  A very soft systolic murmur.  S2 splits.  There is no gallop.  ABDOMEN:  Soft.  EXTREMITIES:  No edema.  Pulses are intact.   ASSESSMENT/PLAN:  Mrs. Galeno is doing well.  I am glad she has received  symptomatic relief from metoprolol.  We will see her back in a year.  I  discussed all the findings of the Holter monitor, Cardiolite and  echocardiogram with her in detail.     Thomas C. Daleen Squibb, MD, [facility]    TCW/MedQ  DD: 06/16/2006  DT: 06/18/2006  Job #: [id number]   cc:   [name], M.D.

## 2011-01-14 NOTE — Assessment & Plan Note (Signed)
[facility]                         GASTROENTEROLOGY OFFICE NOTE   NAME:LONGZoey, Bidwell                           MRN:          [id number]  DATE:                                      DOB:          11/04/1945    I had a telephone conversation with Ms. Ciresi, who is a patient of mine.  She has microscopic colitis.  She is having some worsening diarrhea  symptoms after she suffered a food-borne gastrointestinal illness about  a month ago.  Things are improving, but she is traveling to [place] next  week and is concerned about urgent defecation.  Pepto-Bismol has helped,  but she thinks she might need something stronger, and I agree.   She will try Imodium AD, but I have also prescribed Lomotil 1 to 2 every  4 hours as needed for diarrhea to be used #90 no refills.     Iva Boop, MD,FACG  Electronically Signed    CEG/MedQ  DD: 05/12/2007  DT: 05/13/2007  Job #: [id number]

## 2011-01-14 NOTE — Consult Note (Signed)
NAME:  [name], [name]                  ACCOUNT NO.:  [id number]   MEDICAL RECORD NO.:  [id number]          PATIENT TYPE:  INP   LOCATION:  3736                         FACILITY:  [facility]   PHYSICIAN:  Arvilla Meres, M.D. [facility] BIRTH:  06/08/1946   DATE OF CONSULTATION:  04/04/2006  DATE OF DISCHARGE:  04/04/2006                                   CONSULTATION   Ms. [name] is a 65 year old white female who is admitted for further  evaluation by primary care physician secondary to shortness of breath.  Ms.  [name] is somewhat of a vague historian, but she describes at least three days  ago an inability to take a deep breath.  She states she is not really short  of breath, but she just feels like she is not getting enough air.  This is  not necessarily associated with exertion.  Her husband thinks it seems to be  associated with stress, for example, yesterday occurred while she was at  lunch trying to plan a party, and a couple of days ago while she was at a  birthday party with a bunch of children, although Ms. [name] states that it  can occur anytime, even when she is by herself at rest.  She has not had any  nocturnal episodes.  She is not sure what aggravates the sensation, but she  states that when this occurs she tries to relax and it will resolve within a  couple of minutes.  On a scale of 0 to 10, she gives it a 2 to 3.  She  denies any exertional limitations or chest discomfort.  She also describes  occasional skipping of the heartbeat that has been going on for a [place]  time.  This may occur one to two times today and last less than a minute.  The skipping of the heartbeats is not necessarily associated with her  inability to take a deep breath.  Although she states that yesterday while  walking home from a neighbor's house, she did feel slightly short of breath,  and she also noticed a skipping of the heartbeat.  Her blood pressure and  pulse were taken by her husband, and her  husband reports that her blood  pressure is 118 systolically, and her heartbeat was 73.   PAST MEDICAL HISTORY:  NO KNOWN DRUG ALLERGIES.   MEDICATIONS:  Prior to admission include:  1.  Lexapro 10 mg q.d.  2.  Nexium 40 mg q.d.  3.  Zocor 40 mg q.h.s.  4.  Aspirin 81 q.d.  5.  Calcium unknown dosage b.i.d.  6.  Multivitamin with iron q.d.  7.  She stopped taking her progesterone approximately a month ago, and she      stopped taking her Allegra approximately six months ago.   She has a history of hyperlipidemia, unknown last check.  She denies any  previous surgeries.  A stress test was performed by Dr. Earl [name] in 2006, and  according to Ms. [name], she states that this was okay.  She denies any  problems with diabetes, hypertension, myocardial  infarction, CVA, COPD,  bleeding dyscrasias or thyroid dysfunction.   SOCIAL HISTORY:  She resides in [place] with her husband.  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  She has  not smoked for 35 years.  Prior to that, she smoked a pack per day for five  years.  She drinks two glasses of red wine a day.  She denies any illicit  drugs or herbal medication.  She tries to maintain a low-fat diet.  She  states that she will walk approximately four times per week at a mild and a  half without difficulty.   FAMILY HISTORY:  Her mother is alive at the age of 109 with a history of  femoral bypass and hip and knee replacement.  Her father died at the age of  76 with a history of lung cancer, COPD and atrial fibrillation.  She has one  sister, two brothers who are alive and well.   REVIEW OF SYSTEMS:  In addition to above is notable for an early morning  headache, glasses, good dentition, rosacea, snoring but denies sleep apnea.  She has not had a menstrual cycle for at least two months.  She describes a  three-month history of diarrhea that Dr. Leone [name] has scheduled her for a  colonoscopy next Monday, and  some right lower quadrant sharp abdominal  discomfort over the last several months associated with her diarrhea, but  she feels that her diarrhea and abdominal discomfort has also improved.  She  has not been seen by her GYN since her last period.   PHYSICAL EXAMINATION:  GENERAL:  Well-nourished, well-developed pleasant  white female in no apparent distress.  VITAL SIGNS:  T-max is 98.4, temperature is 98.2, pulse 58, respirations 20,  blood pressure 134/74, 63 kg on admission, 96% sats on room air, telemetry:  normal sinus rhythm.  HEENT:  Unremarkable.  NECK:  Supple without thyromegaly, adenopathy, JVD or carotid bruits.  CHEST:  Symmetrical excursion, clear to auscultation.  HEART:  PMI is not in place.  Regular rate and rhythm.  She does have an S4.  Murmurs, rubs, clicks or gallops are not appreciated.  SKIN/INTEGUMENT:  Appears to be intact.  ABDOMEN:  Slightly rounded.  Bowel sounds present without organomegaly,  masses or tenderness.  EXTREMITIES:  Negative cyanosis, clubbing or edema.  Peripheral pulses are  symmetrical and intact.   Chest x-ray:  No active disease.  EKG shows normal sinus rhythm, left axis  deviation delay, R-wave:  Normal intervals.  Ventricular rate 66, no old  EKGs for comparison.  H&H is 12.9 and 37.3, normal indices, platelets 255,  WBCs 6.9, sodium 137, potassium 34, BUN 14, creatinine 0.8, glucose 121,  normal LFTs.  CK-MBs and troponins have been within normal limits x2.  D-  dimer is 0.4, PTT 27, PT 12.6, INR 0.9.   IMPRESSION/PLAN:  Dr. Gala [name] reviewed the patient's history, spoke with  and examined the patient and determined course of plan.  Dr. Gala [name]  suspected this is a non-cardiac issue and feels that it may be related to  menopausal symptoms and/or anxiety.  He felt that she could be discharged  home with outpatient followup.  From a cardiac perspective, we have arranged for a stress Myoview on Friday, April 07, 2006, at 9:45, an  echocardiogram  on 04/11/2006 at 8:30.  It is noted that these could not be done on the same  day for scheduling purposes, as  Dr. Gala [name] wanted these tests done within  the next week.  The office will call her with arrangements for a 48-hour  Holter monitor to further evaluate her palpitations.  She will follow up  with Dr. Reuel Boom Bensimhon's physician assistant on April 17, 2006 at 2:00  p.m. to review the previously mentioned test.  Dr. Gala [name] spoke with Dr.  Earl [name], clearing Ms. Ennis for discharge from a cardiac perspective.      Joellyn Rued, P.A. [facility]      Arvilla Meres, M.D. [facility]  Electronically Signed    EW/MEDQ  D:  04/04/2006  T:  04/04/2006  Job:  [id number]   cc:   Arvilla Meres, M.D. [facility]  Iva Boop, M.D. [facility]  Rock Nephew, [place]

## 2011-03-29 ENCOUNTER — Telehealth: Payer: Self-pay | Admitting: Cardiology

## 2011-03-29 NOTE — Telephone Encounter (Signed)
I spoke with Dianne at Dr. Charolotte Eke office and faxed last office note to them from Dr. Daleen Squibb EKG was mailed to them at their request. EKG done 11/2010. Mylo Red RN

## 2011-03-29 NOTE — Telephone Encounter (Signed)
Order for pre-surgery ekg.

## 2011-05-13 ENCOUNTER — Encounter: Payer: Self-pay | Admitting: Internal Medicine

## 2011-06-20 ENCOUNTER — Ambulatory Visit (AMBULATORY_SURGERY_CENTER): Payer: BC Managed Care – PPO

## 2011-06-20 ENCOUNTER — Encounter: Payer: Self-pay | Admitting: Internal Medicine

## 2011-06-20 VITALS — Ht 64.0 in | Wt 136.9 lb

## 2011-06-20 DIAGNOSIS — Z1211 Encounter for screening for malignant neoplasm of colon: Secondary | ICD-10-CM

## 2011-06-20 MED ORDER — PEG-KCL-NACL-NASULF-NA ASC-C 100 G PO SOLR: 1.0000 | Freq: Once | ORAL | Status: DC

## 2011-06-27 LAB — HM DEXA SCAN

## 2011-07-08 ENCOUNTER — Encounter: Payer: Self-pay | Admitting: Internal Medicine

## 2011-07-08 ENCOUNTER — Ambulatory Visit (AMBULATORY_SURGERY_CENTER): Payer: BC Managed Care – PPO | Admitting: Internal Medicine

## 2011-07-08 VITALS — BP 123/74 | HR 59 | Temp 99.0°F | Resp 16 | Ht 64.0 in | Wt 136.0 lb

## 2011-07-08 DIAGNOSIS — Z86010 Personal history of colonic polyps: Secondary | ICD-10-CM

## 2011-07-08 DIAGNOSIS — Z1211 Encounter for screening for malignant neoplasm of colon: Secondary | ICD-10-CM

## 2011-07-08 MED ORDER — SODIUM CHLORIDE 0.9 % IV SOLN: 500.0000 mL | INTRAVENOUS | Status: DC

## 2011-07-08 NOTE — Patient Instructions (Addendum)
No polyps today! Small hemorrhoids in the anal canal were seen. I recommend a routine, repeat colonoscopy in 7-10 years. Since you only had a small polyp 5 years ago and none today, can wait up to 10 years for next routine colonoscopy. Iva Boop, MD, FACG  PLEASE FOLLOW DISCHARGE INSTRUCTIONS GIVEN TODAY. SEE HANDOUTS. RESUME CURRENT MEDICATIONS TODAY. CALL us WITH ANY QUESTIONS OR CONCERNS [phone]. WE WILL CALL YOU Monday MORNING TO CHECK ON YOU. THANK YOU!

## 2011-07-09 ENCOUNTER — Encounter: Payer: Self-pay | Admitting: Internal Medicine

## 2011-07-11 ENCOUNTER — Telehealth: Payer: Self-pay | Admitting: *Deleted

## 2011-07-11 NOTE — Telephone Encounter (Signed)
Follow up Call- Patient questions:  Do you have a fever, pain , or abdominal swelling? no Pain Score  0 *  Have you tolerated food without any problems? yes  Have you been able to return to your normal activities? yes  Do you have any questions about your discharge instructions: Diet   no Medications  no Follow up visit  no  Do you have questions or concerns about your Care? no  Actions: * If pain score is 4 or above: No action needed, pain <4.  "I'm doing fine." per pt.

## 2011-10-06 ENCOUNTER — Other Ambulatory Visit: Payer: Self-pay | Admitting: Cardiology

## 2011-12-14 ENCOUNTER — Encounter: Payer: Self-pay | Admitting: *Deleted

## 2011-12-28 ENCOUNTER — Ambulatory Visit (INDEPENDENT_AMBULATORY_CARE_PROVIDER_SITE_OTHER): Payer: BC Managed Care – PPO | Admitting: Cardiology

## 2011-12-28 ENCOUNTER — Encounter: Payer: Self-pay | Admitting: Cardiology

## 2011-12-28 VITALS — BP 118/74 | Ht 64.0 in | Wt 137.0 lb

## 2011-12-28 DIAGNOSIS — Z8679 Personal history of other diseases of the circulatory system: Secondary | ICD-10-CM

## 2011-12-28 DIAGNOSIS — E785 Hyperlipidemia, unspecified: Secondary | ICD-10-CM

## 2011-12-28 DIAGNOSIS — K219 Gastro-esophageal reflux disease without esophagitis: Secondary | ICD-10-CM

## 2011-12-28 NOTE — Assessment & Plan Note (Signed)
Under good control with low dose beta blocker. Continue conservative management.

## 2011-12-28 NOTE — Progress Notes (Signed)
HPI [name] comes in today for evaluation and management of her history of palpitations, cardiovascular risk factors, and a history of SVT.  She is doing remarkably well except for occasional spontaneous palpitations. They're usually short-lived. She remains on low dose of Lopressor.  She denies exertional chest pain or angina or ischemic equivalent symptoms. She denies orthopnea, PND, edema or syncope. She is very compliant with her medications. Her primary care physician is Dr. Earl Gala and check her labs to take care of her other medical problems.  Past Medical History  Diagnosis Date  . Hyperlipidemia   . Supraventricular premature beats   . GERD (gastroesophageal reflux disease)   . Collagenous colitis 03/2006  . Personal history of colonic polyps 03/2006    5 mm rectal adenoma  . Anxiety   . Seasonal allergies     Current Outpatient Prescriptions  Medication Sig Dispense Refill  . aspirin 81 MG tablet Take 81 mg by mouth daily.        . Calcium Carbonate (CALCIUM 500 PO) Take by mouth 2 (two) times daily.        . Cholecalciferol (VITAMIN D) 2000 UNITS CAPS Take by mouth daily.        Marland Kitchen escitalopram (LEXAPRO) 10 MG tablet Take 10 mg by mouth daily.        Marland Kitchen esomeprazole (NEXIUM) 40 MG capsule Take 40 mg by mouth daily before breakfast.        . estradiol (VAGIFEM) 25 MCG vaginal tablet Place 25 mcg vaginally every 14 (fourteen) days.        Marland Kitchen loratadine (CLARITIN) 10 MG tablet Take 10 mg by mouth daily.        . metoprolol tartrate (LOPRESSOR) 25 MG tablet TAKE 1 TABLET TWICE A DAY  60 tablet  2  . simvastatin (ZOCOR) 20 MG tablet Take 20 mg by mouth at bedtime.          Allergies  Allergen Reactions  . Epinephrine     REACTION: heart flutter  . Neosporin (Neomycin-Polymyxin-Gramicidin) Itching    Uses bacotracin    Family History  Problem Relation Age of Onset  . Lung cancer Father     died at age 83  . COPD Father   . Atrial fibrillation Father   . Heart attack  Cousin   . Colon cancer Cousin   . Colon cancer Maternal Aunt   . Heart attack Paternal Grandfather   . Heart attack Maternal Grandfather     History   Social History  . Marital Status: Married    Spouse Name: N/A    Number of Children: N/A  . Years of Education: N/A   Occupational History  . retired    Social History Main Topics  . Smoking status: Former Games developer  . Smokeless tobacco: Not on file  . Alcohol Use: Yes  . Drug Use: No  . Sexually Active: Not on file   Other Topics Concern  . 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 Aggie Hacker of  [facility]    ROS ALL NEGATIVE EXCEPT THOSE NOTED IN HPI  PE  General Appearance: well developed, well nourished in no acute distress HEENT: symmetrical face, PERRLA, good dentition  Neck: no JVD, thyromegaly, or adenopathy, trachea midline Chest: symmetric without deformity Cardiac: PMI non-displaced, RRR, normal S1, S2, no gallop or murmur Lung: clear to ausculation and percussion Vascular: all pulses full without bruits  Abdominal:  nondistended, nontender, good bowel sounds, no HSM, no bruits Extremities: no cyanosis, clubbing or edema, no sign of DVT, no varicosities  Skin: normal color, no rashes Neuro: alert and oriented x 3, non-focal Pysch: normal affect  EKG Normal sinus rhythm, RSR prime in V1 V2, no acute changes. BMET No results found for this basename: na, k, cl, co2, glucose, bun, creatinine, calcium, gfrnonaa, gfraa    Lipid Panel  No results found for this basename: chol, trig, hdl, cholhdl, vldl, ldlcalc    CBC No results found for this basename: wbc, rbc, hgb, hct, plt, mcv, mch, mchc, rdw, neutrabs, lymphsabs, monoabs, eosabs, basosabs

## 2011-12-28 NOTE — Patient Instructions (Signed)
Your physician recommends that you continue on your current medications as directed. Please refer to the Current Medication list given to you today.   Your physician wants you to follow-up in: 1 year with Dr. Wall. You will receive a reminder letter in the mail two months in advance. If you don't receive a letter, please call our office to schedule the follow-up appointment.  

## 2011-12-29 ENCOUNTER — Other Ambulatory Visit: Payer: Self-pay | Admitting: Cardiology

## 2012-08-03 LAB — HM MAMMOGRAPHY: HM Mammogram: NEGATIVE

## 2012-09-12 ENCOUNTER — Other Ambulatory Visit: Payer: Self-pay | Admitting: Otolaryngology

## 2012-09-18 ENCOUNTER — Encounter: Payer: Self-pay | Admitting: Cardiology

## 2012-12-05 ENCOUNTER — Other Ambulatory Visit: Payer: Self-pay | Admitting: Cardiology

## 2013-01-31 ENCOUNTER — Ambulatory Visit (INDEPENDENT_AMBULATORY_CARE_PROVIDER_SITE_OTHER): Payer: Medicare Other | Admitting: Cardiology

## 2013-01-31 ENCOUNTER — Encounter: Payer: Self-pay | Admitting: Cardiology

## 2013-01-31 VITALS — BP 124/80 | HR 63 | Ht 64.0 in | Wt 136.0 lb

## 2013-01-31 DIAGNOSIS — E785 Hyperlipidemia, unspecified: Secondary | ICD-10-CM

## 2013-01-31 DIAGNOSIS — Z8679 Personal history of other diseases of the circulatory system: Secondary | ICD-10-CM

## 2013-01-31 NOTE — Progress Notes (Signed)
HPI Mrs. Brunton comes in today for further evaluation and management of her history of SVT and palpitations. She is unremarkably well with conservative management. She denies any chest pain, palpitations, presyncope or syncope.  Past Medical History  Diagnosis Date  . Hyperlipidemia   . Supraventricular premature beats   . GERD (gastroesophageal reflux disease)   . Collagenous colitis 03/2006  . Personal history of colonic polyps 03/2006    5 mm rectal adenoma  . Anxiety   . Seasonal allergies     Current Outpatient Prescriptions  Medication Sig Dispense Refill  . aspirin 81 MG tablet Take 81 mg by mouth daily.        . Calcium Carbonate (CALCIUM 500 PO) Take by mouth 2 (two) times daily.        . Cholecalciferol (VITAMIN D) 2000 UNITS CAPS Take by mouth daily.        Marland Kitchen escitalopram (LEXAPRO) 10 MG tablet Take 10 mg by mouth daily.        Marland Kitchen esomeprazole (NEXIUM) 40 MG capsule Take 40 mg by mouth daily before breakfast.        . estradiol (VAGIFEM) 25 MCG vaginal tablet Place 25 mcg vaginally every 14 (fourteen) days.        Marland Kitchen loratadine (CLARITIN) 10 MG tablet Take 10 mg by mouth daily.        . metoprolol tartrate (LOPRESSOR) 25 MG tablet TAKE 1 TABLET TWICE A DAY  60 tablet  2  . simvastatin (ZOCOR) 20 MG tablet Take 20 mg by mouth at bedtime.         No current facility-administered medications for this visit.    Allergies  Allergen Reactions  . Epinephrine     REACTION: heart flutter  . Neosporin (Neomycin-Polymyxin-Gramicidin) Itching    Uses bacotracin    Family History  Problem Relation Age of Onset  . Lung cancer Father     died at age 37  . COPD Father   . Atrial fibrillation Father   . Heart attack Cousin   . Colon cancer Cousin   . Colon cancer Maternal Aunt   . Heart attack Paternal Grandfather   . Heart attack Maternal Grandfather     History   Social History  . Marital Status: Married    Spouse Name: N/A    Number of Children: N/A  . Years of  Education: N/A   Occupational History  . retired    Social History Main Topics  . Smoking status: Former Games developer  . Smokeless tobacco: Not on file  . Alcohol Use: Yes  . Drug Use: No  . Sexually Active: Not on file   Other Topics Concern  . 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 Aggie Hacker of  [facility]    ROS ALL NEGATIVE EXCEPT THOSE NOTED IN HPI  PE  General Appearance: well developed, well nourished in no acute distress, looks younger than stated age HEENT: symmetrical face, PERRLA, good dentition  Neck: no JVD, thyromegaly, or adenopathy, trachea midline Chest: symmetric without deformity Cardiac: PMI non-displaced, RRR, normal S1, S2, no gallop or murmur Lung: clear to ausculation and percussion Vascular: all pulses full without bruits  Abdominal: nondistended, nontender, good bowel sounds, no HSM, no bruits Extremities: no cyanosis, clubbing or edema, no sign of DVT, no varicosities  Skin: normal color, no rashes Neuro: alert and oriented x 3, non-focal Pysch:  normal affect  EKG  normal sinus rhythm, incomplete right bundle pattern, no acute changes BMET No results found for this basename: na, k, cl, co2, glucose, bun, creatinine, calcium, gfrnonaa, gfraa    Lipid Panel  No results found for this basename: chol, trig, hdl, cholhdl, vldl, ldlcalc    CBC No results found for this basename: wbc, rbc, hgb, hct, plt, mcv, mch, mchc, rdw, neutrabs, lymphsabs, monoabs, eosabs, basosabs

## 2013-01-31 NOTE — Patient Instructions (Addendum)
Your physician wants you to follow-up in: 1 year with Dr. Olga Millers. You will receive a reminder letter in the mail two months in advance. If you don't receive a letter, please call our office to schedule the follow-up appointment.  Your physician recommends that you continue on your current medications as directed. Please refer to the Current Medication list given to you today.

## 2013-01-31 NOTE — Assessment & Plan Note (Signed)
Control. No change in medical therapy.

## 2013-03-22 ENCOUNTER — Other Ambulatory Visit: Payer: Self-pay | Admitting: Cardiology

## 2013-04-17 ENCOUNTER — Telehealth: Payer: Self-pay | Admitting: Nurse Practitioner

## 2013-04-17 NOTE — Telephone Encounter (Signed)
S/w pt stated having sob and Left jaw pain radiating a little down neck. Feeling better now pt took 1/2 tablet of lexapro, 1/2 metoprolol, 2 ( 81 mg) asa and feeling much better.  Also stated started taking boniva 2 days ago hasn't taken in 1 year can't remember why pt stopped asked if pt can take metoprolol tonite pt stated takes 2 pills a day I stated can take the other 1/2 metoprolol at bed. I stated if pain comes back pt needs to go to the ER pt stated verbal understanding. Pt stated thought all the pain was due to anxiety. Lawson Fiscal will see pt Friday for an ov

## 2013-04-17 NOTE — Telephone Encounter (Signed)
New Problem   Pt called stating that she was experiencing Shortness of breath jaw pain// made appt for 8/22//

## 2013-04-19 ENCOUNTER — Ambulatory Visit (INDEPENDENT_AMBULATORY_CARE_PROVIDER_SITE_OTHER): Payer: BC Managed Care – PPO | Admitting: Nurse Practitioner

## 2013-04-19 ENCOUNTER — Encounter: Payer: Self-pay | Admitting: Nurse Practitioner

## 2013-04-19 VITALS — BP 106/64 | HR 64 | Ht 64.0 in | Wt 136.8 lb

## 2013-04-19 DIAGNOSIS — R6884 Jaw pain: Secondary | ICD-10-CM

## 2013-04-19 DIAGNOSIS — R0602 Shortness of breath: Secondary | ICD-10-CM

## 2013-04-19 LAB — CK TOTAL AND CKMB (NOT AT ARMC)
CK, MB: 2.8 ng/mL (ref 0.3–4.0)
Total CK: 84 U/L (ref 7–177)

## 2013-04-19 LAB — TROPONIN I: Troponin I: <0.3 ng/mL (ref <0.30)

## 2013-04-19 NOTE — Progress Notes (Signed)
Roger Shelter Rosero Date of Birth: September 29, 1945 Medical Record #[id number]  History of Present Illness: Ms. Linquist is seen back today for a work in visit. Seen for Dr. Daleen Squibb. Has had SVT and a history of palpitations. Other issues include HLD, GERD and anxiety. No known CAD. Remote echo from 2007 was normal. Looks like she had a nuclear stress in 2011 but I cannot see the actual results - she says she was told it was normal.   She has been assigned to Dr. Jens Som for future follow up.   Last seen here back in June of 2014 and was doing well. Called earlier this week with jaw pain and shortness of breath - thus added to my schedule today.   Comes in today. Here alone. Says she was basically doing ok until her last visit here 2 1/2 months ago - has felt more short of breath - thought it was heat related and allergies - restarted her antihistamines. Has had more stress in her life. Usually very active - walks fairly regularly. Has been using more caffeine. Two days ago had a spell of jaw aching - persisted for several hours - was off and on but nothing she could make come on or go away. She took 2 baby aspirin, 1/2 Lexapro and 1/2 metoprolol and just sat around. Has not recurred. Had just restarted Boniva a few days earlier. Now off of caffeine - admits that it was pretty excessive prior. She will have some palpitations at times but does not seem to be any worse.   Current Outpatient Prescriptions  Medication Sig Dispense Refill  . aspirin 81 MG tablet Take 81 mg by mouth daily.        . Calcium Carbonate (CALCIUM 500 PO) Take by mouth 2 (two) times daily.        . Cholecalciferol (VITAMIN D) 2000 UNITS CAPS Take by mouth daily.        Marland Kitchen escitalopram (LEXAPRO) 10 MG tablet Take 10 mg by mouth daily.        Marland Kitchen esomeprazole (NEXIUM) 40 MG capsule Take 40 mg by mouth daily before breakfast.        . ibandronate (BONIVA) 150 MG tablet Take 150 mg by mouth every 30 (thirty) days.       Marland Kitchen loratadine (CLARITIN) 10  MG tablet Take 10 mg by mouth daily.        . metoprolol tartrate (LOPRESSOR) 25 MG tablet TAKE 1 TABLET TWICE A DAY  60 tablet  11  . simvastatin (ZOCOR) 20 MG tablet Take 20 mg by mouth at bedtime.         No current facility-administered medications for this visit.    Allergies  Allergen Reactions  . Epinephrine     REACTION: heart flutter  . Neosporin [Neomycin-Polymyxin-Gramicidin] Itching    Uses bacotracin    Past Medical History  Diagnosis Date  . Hyperlipidemia   . Supraventricular premature beats   . GERD (gastroesophageal reflux disease)   . Collagenous colitis 03/2006  . Personal history of colonic polyps 03/2006    5 mm rectal adenoma  . Anxiety   . Seasonal allergies     Past Surgical History  Procedure Laterality Date  . Facial cosmetic surgery  2012  . Colonoscopy  03/2006, 07/08/2011    2007 - collagenous colitis, 5 mm rectal adenoma 2012 - normal    History  Smoking status  . Former Smoker  . Quit date: 08/29/1969  Smokeless tobacco  .  Not on file    History  Alcohol Use  . Yes    Family History  Problem Relation Age of Onset  . Lung cancer Father     died at age 41  . COPD Father   . Atrial fibrillation Father   . Heart attack Cousin   . Colon cancer Cousin   . Colon cancer Maternal Aunt   . Heart attack Paternal Grandfather   . Heart attack Maternal Grandfather     Review of Systems: The review of systems is per the HPI.  All other systems were reviewed and are negative.  Physical Exam: BP 106/64  Pulse 64  Ht 5\' 4"  (1.626 m)  Wt 136 lb 12.8 oz (62.052 kg)  BMI 23.47 kg/m2 Patient is very pleasant and in no acute distress. Skin is warm and dry. Color is normal.  HEENT is unremarkable. Normocephalic/atraumatic. PERRL. Sclera are nonicteric. Neck is supple. No masses. No JVD. Lungs are clear. Cardiac exam shows a regular rate and rhythm. Abdomen is soft. Extremities are without edema. Gait and ROM are intact. No gross neurologic  deficits noted.  LABORATORY DATA:  EKG today shows sinus rhythm, septal infarct - indeterminate - tracing is unchanged from prior EKG.  No results found for this basename: WBC,  HGB,  HCT,  PLT,  GLUCOSE,  CHOL,  TRIG,  HDL,  LDLDIRECT,  LDLCALC,  ALT,  AST,  NA,  K,  CL,  CREATININE,  BUN,  CO2,  TSH,  PSA,  INR,  GLUF,  HGBA1C,  MICROALBUR   ECHO SUMMARY - Overall left ventricular systolic function was normal. There were no left ventricular regional wall motion abnormalities. - The aortic valve was minimally calcified. - The estimated peak right ventricular systolic pressure was within the upper limits of normal. --------------------------------------------------------------- Prepared and Electronically Authenticated by Dunlap Bing M.D. Confirmed 11-Apr-2006 09:54:02   Assessment / Plan: 1. Jaw pain/dyspnea - concerning - EKG unchanged. Risk factors include HLD and past smoker - will arrange for plain GXT and check cardiac enzymes today.   2. SVT/palpitations - has been stable on low dose beta blocker therapy.   Will tentatively see her back next June with Dr. Jens Som.   Patient is agreeable to this plan and will call if any problems develop in the interim.   Rosalio Macadamia, RN, ANP-C [facility] [address] [place], [place]  [address]

## 2013-04-19 NOTE — Patient Instructions (Addendum)
We need to check lab today - we will check cardiac enzymes today  We are going to do a treadmill test  See Dr. Jens Som back as planned  Call the [facility] office at [phone] if you have any questions, problems or concerns.

## 2013-04-24 ENCOUNTER — Encounter: Payer: Self-pay | Admitting: Nurse Practitioner

## 2013-05-10 ENCOUNTER — Ambulatory Visit (INDEPENDENT_AMBULATORY_CARE_PROVIDER_SITE_OTHER): Payer: Medicare Other | Admitting: Nurse Practitioner

## 2013-05-10 DIAGNOSIS — R6884 Jaw pain: Secondary | ICD-10-CM

## 2013-05-10 DIAGNOSIS — R0602 Shortness of breath: Secondary | ICD-10-CM

## 2013-05-10 NOTE — Progress Notes (Signed)
Exercise Treadmill Test  Pre-Exercise Testing Evaluation Rhythm: normal sinus  Rate: 73     Test  Exercise Tolerance Test Ordering MD: Olga Millers, MD  Interpreting MD: Norma Fredrickson, NP  Unique Test No: 1  Treadmill:  1  Indication for ETT: Jaw Pain, SOB, Abnormal EKG  Contraindication to ETT: No   Stress Modality: exercise - treadmill  Cardiac Imaging Performed: non   Protocol: standard Bruce - maximal  Max BP:  180/57  Max MPHR (bpm):  155 85% MPR (bpm):  132  MPHR obtained (bpm):  162 % MPHR obtained:  105%  Reached 85% MPHR (min:sec):  6:21 Total Exercise Time (min-sec):  9 minutes  Workload in METS:  10.1 Borg Scale: 17  Reason ETT Terminated:  patient's desire to stop    ST Segment Analysis At Rest: normal ST segments - no evidence of significant ST depression With Exercise: no evidence of significant ST depression  Other Information Arrhythmia:  No Angina during ETT:  absent (0) Quality of ETT:  diagnostic  ETT Interpretation:  normal - no evidence of ischemia by ST analysis  Comments: Patient presents today for routine GXT. Seen last month with jaw pain and shortness of breath. Will be establishing with Dr. Jens Som. Has had SVT, HLD, GERD and no known CAD.  Today, she exercised on the standard Bruce protocol for a total of 9 minutes. She has good exercise tolerance. Adequate blood pressure response. Clinically negative for jaw/chest pain. Test was stopped due to fatigue and achievement of target heart rate. EKG negative for ischemia. No arrhythmia noted.   Recommendations: Reassurance.  See back as planned.   Call the [facility] office at [phone] if you have any questions, problems or concerns.   Rosalio Macadamia, RN, ANP-C [facility] [address] [place], [place]  [address]

## 2013-05-30 ENCOUNTER — Encounter: Payer: Self-pay | Admitting: Nurse Practitioner

## 2013-05-30 ENCOUNTER — Ambulatory Visit: Payer: Self-pay | Admitting: Nurse Practitioner

## 2013-05-31 ENCOUNTER — Ambulatory Visit: Payer: Self-pay | Admitting: Nurse Practitioner

## 2013-06-03 ENCOUNTER — Encounter: Payer: Self-pay | Admitting: Nurse Practitioner

## 2013-06-03 ENCOUNTER — Ambulatory Visit (INDEPENDENT_AMBULATORY_CARE_PROVIDER_SITE_OTHER): Payer: Medicare Other | Admitting: Nurse Practitioner

## 2013-06-03 VITALS — BP 104/60 | HR 64 | Resp 16 | Ht 64.0 in | Wt 138.0 lb

## 2013-06-03 DIAGNOSIS — M810 Age-related osteoporosis without current pathological fracture: Secondary | ICD-10-CM

## 2013-06-03 DIAGNOSIS — Z01419 Encounter for gynecological examination (general) (routine) without abnormal findings: Secondary | ICD-10-CM

## 2013-06-03 NOTE — Progress Notes (Signed)
Patient ID: [name], female   DOB: 09-05-1945, 67 y.o.   MRN: [id number] 67 y.o. G20P2002 Married Caucasian Fe here for annual exam.  Off Boniva for 1-1.5 years and has restarted X 2 months.  Order will be placed for BMD this year.  She and husband have traveled a lot this year.  He has now retired.  No LMP recorded. Patient is postmenopausal.          Sexually active: no  The current method of family planning is post menopausal status.    Exercising: yes  Gym/ health club routine includes yoga.  Also walking 3-4 times per week. Smoker:  Former   Health Maintenance: Pap: 05/28/12, WNL, neg HR HPV MMG: 08/03/12, BI-RADS 1: negative Colonoscopy: 07/08/11, repeat 7 years BMD: 06/27/2011 T Score: Spine: -2.6; right hip neck -1.5; total -1.3 Td: 03/01/03 Stress Test: 05/10/13 - normal Labs: PCP   reports that she quit smoking about 43 years ago. She has never used smokeless tobacco. She reports that she drinks about 4.2 ounces of alcohol per week. She reports that she does not use illicit drugs.  Past Medical History  Diagnosis Date  . Hyperlipidemia   . Supraventricular premature beats   . GERD (gastroesophageal reflux disease)   . Collagenous colitis 03/2006  . Personal history of colonic polyps 03/2006    5 mm rectal adenoma  . Anxiety   . Seasonal allergies     Past Surgical History  Procedure Laterality Date  . Facial cosmetic surgery  2012  . Colonoscopy  03/2006, 07/08/2011    2007 - collagenous colitis, 5 mm rectal adenoma 2012 - normal    Current Outpatient Prescriptions  Medication Sig Dispense Refill  . aspirin 81 MG tablet Take 81 mg by mouth daily.        . Calcium Carbonate (CALCIUM 500 PO) Take by mouth 2 (two) times daily.        . Cholecalciferol (VITAMIN D) 2000 UNITS CAPS Take by mouth daily.        Marland Kitchen escitalopram (LEXAPRO) 10 MG tablet Take 10 mg by mouth daily.        Marland Kitchen esomeprazole (NEXIUM) 40 MG capsule Take 40 mg by mouth daily before breakfast.        .  ibandronate (BONIVA) 150 MG tablet Take 150 mg by mouth every 30 (thirty) days.       Marland Kitchen loratadine (CLARITIN) 10 MG tablet Take 10 mg by mouth daily.        . metoprolol tartrate (LOPRESSOR) 25 MG tablet TAKE 1 TABLET TWICE A DAY  60 tablet  11  . simvastatin (ZOCOR) 20 MG tablet Take 20 mg by mouth at bedtime.         No current facility-administered medications for this visit.    Family History  Problem Relation Age of Onset  . Lung cancer Father     died at age 75  . COPD Father   . Atrial fibrillation Father   . Heart attack Cousin   . Colon cancer Cousin   . Colon cancer Maternal Aunt   . Heart attack Paternal Grandfather   . Heart attack Maternal Grandfather     ROS:  Pertinent items are noted in HPI.  Otherwise, a comprehensive ROS was negative.  Exam:   BP 104/60  Pulse 64  Resp 16  Ht 5\' 4"  (1.626 m)  Wt 138 lb (62.596 kg)  BMI 23.68 kg/m2 Height: 5\' 4"  (162.6 cm)  Ht  Readings from Last 3 Encounters:  06/03/13 5\' 4"  (1.626 m)  04/19/13 5\' 4"  (1.626 m)  01/31/13 5\' 4"  (1.626 m)    General appearance: alert, cooperative and appears stated age Head: Normocephalic, without obvious abnormality, atraumatic Neck: no adenopathy, supple, symmetrical, trachea midline and thyroid normal to inspection and palpation Lungs: clear to auscultation bilaterally Breasts: normal appearance, no masses or tenderness Heart: regular rate and rhythm Abdomen: soft, non-tender; no masses,  no organomegaly Extremities: extremities normal, atraumatic, no cyanosis or edema Skin: Skin color, texture, turgor normal. No rashes or lesions Lymph nodes: Cervical, supraclavicular, and axillary nodes normal. No abnormal inguinal nodes palpated Neurologic: Grossly normal   Pelvic: External genitalia:  no lesions              Urethra:  normal appearing urethra with no masses, tenderness or lesions              Bartholin's and Skene's: normal                 Vagina: normal appearing vagina with  normal color and discharge, no lesions              Cervix: anteverted              Pap taken: no Bimanual Exam:  Uterus:  normal size, contour, position, consistency, mobility, non-tender              Adnexa: no mass, fullness, tenderness               Rectovaginal: Confirms               Anus:  normal sphincter tone, no lesions  A:  Well Woman with normal exam  Postmenopausal no HRT other than a few months initially  Osteoporosis now back on Boniva per PCP X 2 months; after being off for a year.  Initially started in 2010 by Dr. Precious Bard.  History of heart palpitations  P:   Pap smear as per guidelines   Will repeat BMD this year in November or December.  Counseled on breast self exam, adequate intake of calcium and vitamin D, diet and exercise return annually or prn  An After Visit Summary was printed and given to the patient.

## 2013-06-03 NOTE — Patient Instructions (Signed)

## 2013-06-05 ENCOUNTER — Encounter: Payer: Self-pay | Admitting: Cardiology

## 2013-06-05 ENCOUNTER — Ambulatory Visit (INDEPENDENT_AMBULATORY_CARE_PROVIDER_SITE_OTHER): Payer: Medicare Other | Admitting: Cardiology

## 2013-06-05 VITALS — BP 112/50 | HR 60 | Ht 64.0 in | Wt 137.8 lb

## 2013-06-05 DIAGNOSIS — Z8679 Personal history of other diseases of the circulatory system: Secondary | ICD-10-CM

## 2013-06-05 DIAGNOSIS — R079 Chest pain, unspecified: Secondary | ICD-10-CM

## 2013-06-05 DIAGNOSIS — E785 Hyperlipidemia, unspecified: Secondary | ICD-10-CM

## 2013-06-05 NOTE — Progress Notes (Signed)
Encounter reviewed by Dr. Brook Silva.  

## 2013-06-05 NOTE — Assessment & Plan Note (Signed)
Continue beta blocker. 

## 2013-06-05 NOTE — Assessment & Plan Note (Signed)
Continue statin. Lipids and liver monitored by primary care. 

## 2013-06-05 NOTE — Assessment & Plan Note (Signed)
No recurrent symptoms. Exercise treadmill negative.

## 2013-06-05 NOTE — Patient Instructions (Signed)
Your physician wants you to follow-up in: ONE YEAR WITH DR CRENSHAW You will receive a reminder letter in the mail two months in advance. If you don't receive a letter, please call our office to schedule the follow-up appointment.  

## 2013-06-05 NOTE — Progress Notes (Signed)
HPI: 67 year old female for followup of SVT. Previously followed by Dr. Daleen Squibb. Echocardiogram in August of 2007 showed normal LV function. Stress Myoview in 2011 apparently was normal. Seen recently by Norma Fredrickson for jaw pain. Exercise treadmill in September of 2014; patient exercised for 9 minutes with no symptoms and normal ST segments. Since she was last seen she denies dyspnea on exertion, orthopnea, PND, pedal edema, syncope, exertional chest pain or recurrent jaw pain. Palpitations are controlled with beta-blockade.  Current Outpatient Prescriptions  Medication Sig Dispense Refill  . aspirin 81 MG tablet Take 81 mg by mouth daily.        . Calcium Carbonate (CALCIUM 500 PO) Take by mouth 2 (two) times daily.        . Cholecalciferol (VITAMIN D) 2000 UNITS CAPS Take by mouth daily.        Marland Kitchen escitalopram (LEXAPRO) 10 MG tablet Take 10 mg by mouth daily.        Marland Kitchen esomeprazole (NEXIUM) 40 MG capsule Take 40 mg by mouth daily before breakfast.        . ibandronate (BONIVA) 150 MG tablet Take 150 mg by mouth every 30 (thirty) days.       Marland Kitchen loratadine (CLARITIN) 10 MG tablet Take 10 mg by mouth daily.        . metoprolol tartrate (LOPRESSOR) 25 MG tablet TAKE 1 TABLET TWICE A DAY  60 tablet  11  . simvastatin (ZOCOR) 20 MG tablet Take 20 mg by mouth at bedtime.         No current facility-administered medications for this visit.     Past Medical History  Diagnosis Date  . Hyperlipidemia   . Supraventricular premature beats   . GERD (gastroesophageal reflux disease)   . Collagenous colitis 03/2006  . Personal history of colonic polyps 03/2006    5 mm rectal adenoma  . Anxiety   . Seasonal allergies   . Osteoporosis 10/10    Past Surgical History  Procedure Laterality Date  . Facial cosmetic surgery  2012  . Colonoscopy  03/2006, 07/08/2011    2007 - collagenous colitis, 5 mm rectal adenoma 2012 - normal  . Wisdom tooth extraction  age 68    History   Social History  .  Marital Status: Married    Spouse Name: N/A    Number of Children: 2  . Years of Education: N/A   Occupational History  . retired    Social History Main Topics  . Smoking status: Former Smoker -- 0.50 packs/day for 5 years    Types: Cigarettes    Quit date: 08/29/1969  . Smokeless tobacco: Never Used  . Alcohol Use: 4.2 oz/week    7 Glasses of wine per week     Comment: wine at night  . Drug Use: No  . Sexual Activity: No   Other Topics Concern  . 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 Aggie Hacker of  [facility]    ROS: no fevers or chills, productive cough, hemoptysis, dysphasia, odynophagia, melena, hematochezia, dysuria, hematuria, rash, seizure activity, orthopnea, PND, pedal edema, claudication. Remaining systems are negative.  Physical Exam: Well-developed well-nourished in no acute distress.  Skin is warm and dry.  HEENT is normal.  Neck is supple.  Chest is clear to auscultation with normal expansion.  Cardiovascular exam is regular rate and rhythm.  Abdominal  exam nontender or distended. No masses palpated. Extremities show no edema. neuro grossly intact

## 2013-07-23 ENCOUNTER — Other Ambulatory Visit: Payer: Self-pay | Admitting: Nurse Practitioner

## 2013-07-23 NOTE — Telephone Encounter (Signed)
Ask her to call PCP since he was the one restarting this.  Initially Dr. Precious Bard did start this meds. I did order the last BMD - but PCP said to go back on meds.

## 2013-07-23 NOTE — Telephone Encounter (Signed)
Per your AEX note 06/03/13 patient is back on Boniva per PCP x 2 months is patient supposed to be getting Boniva from them or [place]?  Please advise?

## 2013-07-24 NOTE — Telephone Encounter (Signed)
Patient aware to call PCP and see if they can refill it since he was the one restarting.

## 2013-07-24 NOTE — Telephone Encounter (Signed)
Left Message To Call Back  

## 2013-08-15 ENCOUNTER — Telehealth: Payer: Self-pay | Admitting: *Deleted

## 2013-08-15 NOTE — Telephone Encounter (Signed)
I have attempted to contact this patient by phone with the following results: left message to return my call on answering machine (home). RE: bone density report.  Hard copy with instructions on chart on my desk.

## 2013-08-15 NOTE — Telephone Encounter (Signed)
Pt notified of BMD results. Hardcopy sent to scan.

## 2013-11-13 ENCOUNTER — Other Ambulatory Visit: Payer: Self-pay | Admitting: Nurse Practitioner

## 2013-11-14 NOTE — Telephone Encounter (Signed)
Last AEX 06/03/2013 Boniva is managed by PCP - Per Ms Patty's AEX note.

## 2013-11-15 ENCOUNTER — Other Ambulatory Visit: Payer: Self-pay | Admitting: Nurse Practitioner

## 2013-11-15 NOTE — Telephone Encounter (Signed)
eScribe request from [facility] for refill on BONIVA Last filled - 05/28/12 Last AEX - 06/03/13 Next AEX - 06/04/14 Last BMD - 08/09/13  Per last BMD note, pt to continue Boniva per PGrubb. RX sent until AEX in 05/2014.

## 2014-02-11 ENCOUNTER — Ambulatory Visit: Payer: Medicare Other | Admitting: Cardiology

## 2014-03-24 ENCOUNTER — Other Ambulatory Visit: Payer: Self-pay | Admitting: Cardiology

## 2014-05-28 ENCOUNTER — Encounter: Payer: Self-pay | Admitting: Internal Medicine

## 2014-06-04 ENCOUNTER — Encounter: Payer: Self-pay | Admitting: Nurse Practitioner

## 2014-06-04 ENCOUNTER — Ambulatory Visit (INDEPENDENT_AMBULATORY_CARE_PROVIDER_SITE_OTHER): Payer: Medicare Other | Admitting: Nurse Practitioner

## 2014-06-04 VITALS — BP 108/76 | HR 76 | Ht 64.0 in | Wt 138.0 lb

## 2014-06-04 DIAGNOSIS — Z01419 Encounter for gynecological examination (general) (routine) without abnormal findings: Secondary | ICD-10-CM

## 2014-06-04 MED ORDER — IBANDRONATE SODIUM 150 MG PO TABS: ORAL_TABLET | ORAL | Status: DC

## 2014-06-04 NOTE — Progress Notes (Signed)
Patient ID: [name] AtesCarol M [name], female   DOB: 05-16-1946, 68 y.o.   MRN: [id number] 68 y.o. G2P2002 Married Caucasian Fe here for annual exam.  Retired since 2005. Now with 4 grandchildren.  Patient's last menstrual period was 04/29/2006.     Sexually active: no  The current method of family planning is post menopausal status.  Exercising: yes Gym/ health club routine includes yoga. Also walking 3-4 times per week.  Smoker: Former   Health Maintenance:  Pap: 05/28/12, WNL, neg HR HPV  MMG: 08/09/13, 3D, BI-RADS 1: negative  Colonoscopy: 07/08/11, repeat 7 years  BMD: 08/09/13 T Score: Spine: -2.7; right hip neck -1.5; left hip neck -1.5 Td: 03/01/03 Shingles: 2009 Prevnar: 2015  Stress Test: 05/10/13 - normal  Labs:  PCP   reports that she quit smoking about 44 years ago. Her smoking use included Cigarettes. She has a 2.5 pack-year smoking history. She has never used smokeless tobacco. She reports that she drinks about 4.2 ounces of alcohol per week. She reports that she does not use illicit drugs.  Past Medical History  Diagnosis Date  . Hyperlipidemia   . Supraventricular premature beats   . GERD (gastroesophageal reflux disease)   . Collagenous colitis 03/2006  . Personal history of colonic polyps 03/2006    5 mm rectal adenoma  . Anxiety   . Seasonal allergies   . Osteoporosis 10/10    Past Surgical History  Procedure Laterality Date  . Facial cosmetic surgery  2012  . Colonoscopy  03/2006, 07/08/2011    2007 - collagenous colitis, 5 mm rectal adenoma 2012 - normal  . Wisdom tooth extraction  age 68    Current Outpatient Prescriptions  Medication Sig Dispense Refill  . aspirin 81 MG tablet Take 81 mg by mouth daily.        . Calcium Carbonate (CALCIUM 500 PO) Take by mouth 2 (two) times daily.        . Cholecalciferol (VITAMIN D) 2000 UNITS CAPS Take by mouth daily.        Marland Kitchen. escitalopram (LEXAPRO) 10 MG tablet Take 10 mg by mouth daily.        Marland Kitchen. esomeprazole (NEXIUM) 40 MG  capsule Take 40 mg by mouth daily before breakfast.        . ibandronate (BONIVA) 150 MG tablet Take in the morning with a full glass of water, on an empty stomach, and do not take anything else by mouth or lie down for the next 30 min.  3 tablet  3  . loratadine (CLARITIN) 10 MG tablet Take 10 mg by mouth daily.        . metoprolol tartrate (LOPRESSOR) 25 MG tablet TAKE 1 TABLET TWICE A DAY  60 tablet  2  . simvastatin (ZOCOR) 20 MG tablet Take 20 mg by mouth at bedtime.         No current facility-administered medications for this visit.    Family History  Problem Relation Age of Onset  . Lung cancer Father     died at age 66  . COPD Father   . Atrial fibrillation Father   . Heart attack Cousin   . Colon cancer Cousin   . Colon cancer Maternal Aunt   . Heart attack Paternal Grandfather   . Heart attack Maternal Grandfather   . Hyperlipidemia Sister   . Hyperlipidemia Brother   . Hyperlipidemia Brother     ROS:  Pertinent items are noted in HPI.  Otherwise, a comprehensive ROS  was negative.  Exam:   BP 108/76  Pulse 76  Ht 5\' 4"  (1.626 m)  Wt 138 lb (62.596 kg)  BMI 23.68 kg/m2  LMP 04/29/2006 Height: 5\' 4"  (162.6 cm)  Ht Readings from Last 3 Encounters:  06/04/14 5\' 4"  (1.626 m)  06/05/13 5\' 4"  (1.626 m)  06/03/13 5\' 4"  (1.626 m)    General appearance: alert, cooperative and appears stated age Head: Normocephalic, without obvious abnormality, atraumatic Neck: no adenopathy, supple, symmetrical, trachea midline and thyroid normal to inspection and palpation Lungs: clear to auscultation bilaterally Breasts: normal appearance, no masses or tenderness Heart: regular rate and rhythm Abdomen: soft, non-tender; no masses,  no organomegaly Extremities: extremities normal, atraumatic, no cyanosis or edema Skin: Skin color, texture, turgor normal. No rashes or lesions Lymph nodes: Cervical, supraclavicular, and axillary nodes normal. No abnormal inguinal nodes  palpated Neurologic: Grossly normal   Pelvic: External genitalia:  no lesions              Urethra:  normal appearing urethra with no masses, tenderness or lesions              Bartholin's and Skene's: normal                 Vagina: normal appearing vagina with normal color and discharge, no lesions              Cervix: anteverted              Pap taken: No. Bimanual Exam:  Uterus:  normal size, contour, position, consistency, mobility, non-tender              Adnexa: no mass, fullness, tenderness               Rectovaginal: Confirms               Anus:  normal sphincter tone, no lesions  A:  Well Woman with normal exam  Postmenopausal no HRT other than a few months initially   Osteoporosis now back on Boniva per PCP X 14 months; after being off for a year.   Initially started Boniva in 2010 by Dr. Precious Bard.   History of heart palpitations  History  of disordered proliferative endometrium - off Prometrium since 10/2006    P:   Reviewed health and wellness pertinent to exam  Pap smear not taken today  Mammogram is due 12/15  She will be checking on TDaP  Refill on Boniva - changing to new PCP in November - Dr. Waynard Edwards   Counseled on breast self exam, mammography screening, osteoporosis, adequate intake of calcium and vitamin D, diet and exercise, Kegel's exercises return annually or prn  An After Visit Summary was printed and given to the patient.

## 2014-06-04 NOTE — Patient Instructions (Addendum)

## 2014-06-05 ENCOUNTER — Ambulatory Visit (INDEPENDENT_AMBULATORY_CARE_PROVIDER_SITE_OTHER): Payer: Medicare Other | Admitting: Cardiology

## 2014-06-05 ENCOUNTER — Encounter: Payer: Self-pay | Admitting: Cardiology

## 2014-06-05 VITALS — BP 120/80 | HR 65 | Ht 64.0 in | Wt 138.1 lb

## 2014-06-05 DIAGNOSIS — R072 Precordial pain: Secondary | ICD-10-CM

## 2014-06-05 DIAGNOSIS — Z8679 Personal history of other diseases of the circulatory system: Secondary | ICD-10-CM

## 2014-06-05 NOTE — Assessment & Plan Note (Signed)
Continue statin. 

## 2014-06-05 NOTE — Patient Instructions (Signed)
Your physician wants you to follow-up in: ONE YEAR WITH DR CRENSHAW You will receive a reminder letter in the mail two months in advance. If you don't receive a letter, please call our office to schedule the follow-up appointment.  

## 2014-06-05 NOTE — Progress Notes (Signed)
HPI: FU SVT. Echocardiogram in August of 2007 showed normal LV function. Stress Myoview in 2011 apparently was normal. Exercise treadmill in September of 2014; patient exercised for 9 minutes with no symptoms and normal ST segments. Since she was last seen she denies dyspnea on exertion, orthopnea, PND, pedal edema, syncope, exertional chest pain. Palpitations are controlled with beta-blockade.   Current Outpatient Prescriptions  Medication Sig Dispense Refill  . aspirin 81 MG tablet Take 81 mg by mouth daily.        . Calcium Carbonate (CALCIUM 500 PO) Take by mouth 2 (two) times daily.        . Cholecalciferol (VITAMIN D) 2000 UNITS CAPS Take by mouth daily.        Marland Kitchen. escitalopram (LEXAPRO) 10 MG tablet Take 10 mg by mouth daily.        Marland Kitchen. esomeprazole (NEXIUM) 40 MG capsule Take 40 mg by mouth daily before breakfast.        . ibandronate (BONIVA) 150 MG tablet Take in the morning with a full glass of water, on an empty stomach, and do not take anything else by mouth or lie down for the next 30 min.  3 tablet  3  . loratadine (CLARITIN) 10 MG tablet Take 10 mg by mouth daily.        . metoprolol tartrate (LOPRESSOR) 25 MG tablet TAKE 1 TABLET TWICE A DAY  60 tablet  2  . simvastatin (ZOCOR) 20 MG tablet Take 20 mg by mouth at bedtime.         No current facility-administered medications for this visit.     Past Medical History  Diagnosis Date  . Hyperlipidemia   . Supraventricular premature beats   . GERD (gastroesophageal reflux disease)   . Collagenous colitis 03/2006  . Personal history of colonic polyps 03/2006    5 mm rectal adenoma  . Anxiety   . Seasonal allergies   . Osteoporosis 10/10    Past Surgical History  Procedure Laterality Date  . Facial cosmetic surgery  2012  . Colonoscopy  03/2006, 07/08/2011    2007 - collagenous colitis, 5 mm rectal adenoma 2012 - normal  . Wisdom tooth extraction  age 68    History   Social History  . Marital Status: Married   Spouse Name: N/A    Number of Children: 2  . Years of Education: N/A   Occupational History  . retired    Social History Main Topics  . Smoking status: Former Smoker -- 0.50 packs/day for 5 years    Types: Cigarettes    Quit date: 08/29/1969  . Smokeless tobacco: Never Used  . Alcohol Use: 4.2 oz/week    7 Glasses of wine per week     Comment: wine at night  . Drug Use: No  . Sexual Activity: No   Other Topics Concern  . Not on file   Social History Narrative   Married, retired Wellsite geologistart teacher   2 daughters, 4 grandchildren   Son-in-law Dr. Aggie HackerBrian Sumner of  [facility]    ROS: no fevers or chills, productive cough, hemoptysis, dysphasia, odynophagia, melena, hematochezia, dysuria, hematuria, rash, seizure activity, orthopnea, PND, pedal edema, claudication. Remaining systems are negative.  Physical Exam: Well-developed well-nourished in no acute distress.  Skin is warm and dry.  HEENT is normal.  Neck is supple.  Chest is clear to auscultation with normal expansion.  Cardiovascular exam is regular rate and rhythm.  Abdominal exam  nontender or distended. No masses palpated. Extremities show no edema. neuro grossly intact  ECG Sinus rhythm at a rate of 65. RV conduction delay. Left axis deviation.

## 2014-06-05 NOTE — Assessment & Plan Note (Signed)
No recurrent symptoms.Previous functional study negative.

## 2014-06-05 NOTE — Assessment & Plan Note (Signed)
Palpitations are well-controlled beta blocker. Continue.

## 2014-06-08 NOTE — Progress Notes (Signed)
Encounter reviewed by Dr. Nthony Lefferts Silva.  

## 2014-06-24 ENCOUNTER — Other Ambulatory Visit: Payer: Self-pay

## 2014-06-24 MED ORDER — METOPROLOL TARTRATE 25 MG PO TABS: ORAL_TABLET | ORAL | Status: DC

## 2014-06-30 ENCOUNTER — Encounter: Payer: Self-pay | Admitting: Cardiology

## 2015-06-01 NOTE — Progress Notes (Signed)
HPI: FU SVT. Echocardiogram in August of 2007 showed normal LV function. Stress Myoview in 2011 apparently was normal. Exercise treadmill in September of 2014; patient exercised for 9 minutes with no symptoms and normal ST segments. Since she was last seen the patient denies any dyspnea on exertion, orthopnea, PND, pedal edema, palpitations, syncope or chest pain.   Current Outpatient Prescriptions  Medication Sig Dispense Refill  . aspirin 81 MG tablet Take 81 mg by mouth daily.      . Calcium Carbonate (CALCIUM 500 PO) Take by mouth 2 (two) times daily.      . Cholecalciferol (VITAMIN D) 2000 UNITS CAPS Take by mouth daily.      Marland Kitchen escitalopram (LEXAPRO) 10 MG tablet Take 10 mg by mouth daily.      Marland Kitchen esomeprazole (NEXIUM) 40 MG capsule Take 40 mg by mouth daily before breakfast.      . ibandronate (BONIVA) 150 MG tablet Take in the morning with a full glass of water, on an empty stomach, and do not take anything else by mouth or lie down for the next 30 min. 3 tablet 3  . loratadine (CLARITIN) 10 MG tablet Take 10 mg by mouth daily.      . metoprolol tartrate (LOPRESSOR) 25 MG tablet TAKE 1 TABLET TWICE A DAY 60 tablet 12  . simvastatin (ZOCOR) 20 MG tablet Take 20 mg by mouth at bedtime.       No current facility-administered medications for this visit.     Past Medical History  Diagnosis Date  . Hyperlipidemia   . Supraventricular premature beats   . GERD (gastroesophageal reflux disease)   . Collagenous colitis 03/2006  . Personal history of colonic polyps 03/2006    5 mm rectal adenoma  . Anxiety   . Seasonal allergies   . Osteoporosis 10/10    Past Surgical History  Procedure Laterality Date  . Facial cosmetic surgery  2012  . Colonoscopy  03/2006, 07/08/2011    2007 - collagenous colitis, 5 mm rectal adenoma 2012 - normal  . Wisdom tooth extraction  age 74    Social History   Social History  . Marital Status: Married    Spouse Name: N/A  . Number of Children:  2  . Years of Education: N/A   Occupational History  . retired    Social History Main Topics  . Smoking status: Former Smoker -- 0.50 packs/day for 5 years    Types: Cigarettes    Quit date: 08/29/1969  . Smokeless tobacco: Never Used  . Alcohol Use: 4.2 oz/week    7 Glasses of wine per week     Comment: wine at night  . Drug Use: No  . Sexual Activity: No   Other Topics Concern  . 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 Aggie Hacker of  [facility]    ROS: no fevers or chills, productive cough, hemoptysis, dysphasia, odynophagia, melena, hematochezia, dysuria, hematuria, rash, seizure activity, orthopnea, PND, pedal edema, claudication. Remaining systems are negative.  Physical Exam: Well-developed well-nourished in no acute distress.  Skin is warm and dry.  HEENT is normal.  Neck is supple.  Chest is clear to auscultation with normal expansion.  Cardiovascular exam is regular rate and rhythm.  Abdominal exam nontender or distended. No masses palpated. Extremities show no edema. neuro grossly intact  ECG Sinus rhythm at  a rate of 61. RV conduction delay. Cannot rule out prior septal infarct.

## 2015-06-08 ENCOUNTER — Ambulatory Visit (INDEPENDENT_AMBULATORY_CARE_PROVIDER_SITE_OTHER): Payer: Medicare Other | Admitting: Cardiology

## 2015-06-08 ENCOUNTER — Ambulatory Visit: Payer: Medicare Other | Admitting: Nurse Practitioner

## 2015-06-08 ENCOUNTER — Encounter: Payer: Self-pay | Admitting: Cardiology

## 2015-06-08 VITALS — BP 140/80 | HR 61 | Ht 64.0 in | Wt 141.0 lb

## 2015-06-08 DIAGNOSIS — Z8679 Personal history of other diseases of the circulatory system: Secondary | ICD-10-CM

## 2015-06-08 DIAGNOSIS — Z23 Encounter for immunization: Secondary | ICD-10-CM

## 2015-06-08 NOTE — Assessment & Plan Note (Signed)
Patient remains in sinus rhythm. Palpitations are rare. Continue beta blocker. Can consider more aggressive therapy such as ablation of SVT becomes more frequent prolonged in the future.

## 2015-06-08 NOTE — Assessment & Plan Note (Signed)
Continue statin. Lipids and liver monitored by primary care. 

## 2015-06-08 NOTE — Patient Instructions (Signed)
Medication Instructions:   NO CHANGE  Follow-Up:  Your physician wants you to follow-up in: ONE YEAR WITH DR CRENSHAW You will receive a reminder letter in the mail two months in advance. If you don't receive a letter, please call our office to schedule the follow-up appointment.    

## 2015-07-08 ENCOUNTER — Ambulatory Visit (INDEPENDENT_AMBULATORY_CARE_PROVIDER_SITE_OTHER): Payer: Medicare Other | Admitting: Nurse Practitioner

## 2015-07-08 ENCOUNTER — Encounter: Payer: Self-pay | Admitting: Nurse Practitioner

## 2015-07-08 VITALS — BP 126/84 | HR 72 | Ht 64.0 in | Wt 140.0 lb

## 2015-07-08 DIAGNOSIS — Z01419 Encounter for gynecological examination (general) (routine) without abnormal findings: Secondary | ICD-10-CM | POA: Diagnosis not present

## 2015-07-08 DIAGNOSIS — M810 Age-related osteoporosis without current pathological fracture: Secondary | ICD-10-CM | POA: Diagnosis not present

## 2015-07-08 DIAGNOSIS — Z0000 Encounter for general adult medical examination without abnormal findings: Secondary | ICD-10-CM | POA: Diagnosis not present

## 2015-07-08 NOTE — Progress Notes (Signed)
Patient ID: [name], female   DOB: 1946-01-26, 69 y.o.   MRN: [id number] 69 y.o. G2P2002 Married  Caucasian Fe here for annual exam.  No vaso symptoms, some vaginal dryness.  PCP is planning another BMD this December and then discuss treatment options.  She is currently off Boniva.  Patient's last menstrual period was 04/29/2006 (approximate).          Sexually active: No.  The current method of family planning is post menopausal status.    Exercising: Yes.    Home exercise routine includes walking and sometimes elliptical and weights. Smoker:  no  Health Maintenance: Pap: 05/28/12, WNL, neg HR HPV MMG: 02/03/15, 3D, BI-RADS 1: Negative Colonoscopy: 07/08/11, repeat 7 years BMD: 08/09/13 T Score: Spine: -2.7; right hip neck -1.5; left hip neck -1.5 TDaP:  2009 Shingles: 2009 Prevnar: 2015 Labs: PCP   reports that she quit smoking about 45 years ago. Her smoking use included Cigarettes. She has a 2.5 pack-year smoking history. She has never used smokeless tobacco. She reports that she drinks about 4.2 oz of alcohol per week. She reports that she does not use illicit drugs.  Past Medical History  Diagnosis Date  . Hyperlipidemia   . Supraventricular premature beats   . GERD (gastroesophageal reflux disease)   . Collagenous colitis 03/2006  . Personal history of colonic polyps 03/2006    5 mm rectal adenoma  . Anxiety   . Seasonal allergies   . Osteoporosis 10/10    Past Surgical History  Procedure Laterality Date  . Facial cosmetic surgery  2012  . Colonoscopy  03/2006, 07/08/2011    2007 - collagenous colitis, 5 mm rectal adenoma 2012 - normal  . Wisdom tooth extraction  age 55    Current Outpatient Prescriptions  Medication Sig Dispense Refill  . aspirin 81 MG tablet Take 81 mg by mouth daily.      . Calcium Carbonate (CALCIUM 500 PO) Take by mouth 2 (two) times daily.      . Cholecalciferol (VITAMIN D) 2000 UNITS CAPS Take by mouth daily.      Marland Kitchen escitalopram (LEXAPRO) 10  MG tablet Take 10 mg by mouth daily.      Marland Kitchen esomeprazole (NEXIUM) 40 MG capsule Take 40 mg by mouth daily before breakfast.      . metoprolol tartrate (LOPRESSOR) 25 MG tablet TAKE 1 TABLET TWICE A DAY 60 tablet 12  . simvastatin (ZOCOR) 20 MG tablet Take 20 mg by mouth at bedtime.      . ibandronate (BONIVA) 150 MG tablet Take in the morning with a full glass of water, on an empty stomach, and do not take anything else by mouth or lie down for the next 30 min. (Patient not taking: Reported on 07/08/2015) 3 tablet 3   No current facility-administered medications for this visit.    Family History  Problem Relation Age of Onset  . Lung cancer Father     died at age 2  . COPD Father   . Atrial fibrillation Father   . Heart attack Cousin   . Colon cancer Cousin   . Colon cancer Maternal Aunt   . Heart attack Paternal Grandfather   . Heart attack Maternal Grandfather   . Hyperlipidemia Sister   . Hyperlipidemia Brother   . Hyperlipidemia Brother     ROS:  Pertinent items are noted in HPI.  Otherwise, a comprehensive ROS was negative.  Exam:   BP 126/84 mmHg  Pulse 72  Ht 5\' 4"  (1.626 m)  Wt 140 lb (63.504 kg)  BMI 24.02 kg/m2  LMP 04/29/2006 (Approximate) Height: 5\' 4"  (162.6 cm) Ht Readings from Last 3 Encounters:  07/08/15 5\' 4"  (1.626 m)  06/08/15 5\' 4"  (1.626 m)  06/05/14 5\' 4"  (1.626 m)    General appearance: alert, cooperative and appears stated age Head: Normocephalic, without obvious abnormality, atraumatic Neck: no adenopathy, supple, symmetrical, trachea midline and thyroid normal to inspection and palpation Lungs: clear to auscultation bilaterally Breasts: normal appearance, no masses or tenderness Heart: regular rate and rhythm Abdomen: soft, non-tender; no masses,  no organomegaly Extremities: extremities normal, atraumatic, no cyanosis or edema Skin: Skin color, texture, turgor normal. No rashes or lesions Lymph nodes: Cervical, supraclavicular, and axillary  nodes normal. No abnormal inguinal nodes palpated Neurologic: Grossly normal   Pelvic: External genitalia:  no lesions              Urethra:  normal appearing urethra with no masses, tenderness or lesions              Bartholin's and Skene's: normal                 Vagina: normal appearing vagina with normal color and discharge, no lesions.  There is a grade I/II cystocele wit pelvic relaxation.              Cervix: anteverted              Pap taken: Yes.   Bimanual Exam:  Uterus:  normal size, contour, position, consistency, mobility, non-tender              Adnexa: no mass, fullness, tenderness               Rectovaginal: Confirms               Anus:  normal sphincter tone, no lesions  Chaperone present: yes  A:  Well Woman with normal exam  Postmenopausal no HRT other than a few months initially Osteoporosis on Boniva since 2010, then a break in 2014.  Back on Boniva for a short time in 2015 - now off. History of heart palpitations - SVT  New skin lesion beside of left ear  Cystocele with pelvic floor relaxation  P:   Reviewed health and wellness pertinent to exam  Pap smear as above  Mammogram is due 01/2016  Change/ or new skin lesion beside of left ear - will see Dr. Yetta BarreJones  Will get labs and follow  Discussed Kegel exercises, may need Pelvic floor strengthening and may decide to see [facility] PT - also offered for her to see Dr. Edward JollySilva.  She will consider and call.  Counseled on breast self exam, mammography screening, adequate intake of calcium and vitamin D, diet and exercise return annually or prn  An After Visit Summary was printed and given to the patient.

## 2015-07-08 NOTE — Patient Instructions (Addendum)

## 2015-07-09 LAB — HEPATITIS C ANTIBODY: HCV AB: NEGATIVE

## 2015-07-09 LAB — IPS PAP SMEAR ONLY

## 2015-07-13 NOTE — Progress Notes (Signed)
Encounter reviewed by Dr. Judea Fennimore Amundson C. Silva.  

## 2015-07-17 ENCOUNTER — Ambulatory Visit (INDEPENDENT_AMBULATORY_CARE_PROVIDER_SITE_OTHER): Payer: Medicare Other | Admitting: Podiatry

## 2015-07-17 ENCOUNTER — Ambulatory Visit: Payer: Medicare Other

## 2015-07-17 ENCOUNTER — Ambulatory Visit (INDEPENDENT_AMBULATORY_CARE_PROVIDER_SITE_OTHER): Payer: Medicare Other

## 2015-07-17 ENCOUNTER — Other Ambulatory Visit: Payer: Self-pay | Admitting: *Deleted

## 2015-07-17 VITALS — BP 129/79 | HR 62 | Resp 16 | Ht 64.0 in | Wt 140.0 lb

## 2015-07-17 DIAGNOSIS — M2012 Hallux valgus (acquired), left foot: Secondary | ICD-10-CM

## 2015-07-17 DIAGNOSIS — M779 Enthesopathy, unspecified: Secondary | ICD-10-CM

## 2015-07-17 DIAGNOSIS — M2011 Hallux valgus (acquired), right foot: Secondary | ICD-10-CM

## 2015-07-17 MED ORDER — TRIAMCINOLONE ACETONIDE 10 MG/ML IJ SUSP
10.0000 mg | Freq: Once | INTRAMUSCULAR | Status: AC
  Administered 2015-07-17: 10 mg

## 2015-07-17 MED ORDER — METOPROLOL TARTRATE 25 MG PO TABS: ORAL_TABLET | ORAL | Status: DC

## 2015-07-17 NOTE — Progress Notes (Signed)
   Subjective:    Patient ID: [name], female    DOB: 23-Feb-1946, 69 y.o.   MRN: [id number]  HPI Patient presents with foot pain in their left foot; entire top and bottom of foot; bunion hurts; swelling. Pt stated, "injured foot a month ago when wearing flat shoes".   Review of Systems  Musculoskeletal: Positive for arthralgias.  All other systems reviewed and are negative.      Objective:   Physical Exam        Assessment & Plan:

## 2015-07-18 NOTE — Progress Notes (Signed)
Subjective:     Patient ID: [name], female   DOB: 10-31-1945, 69 y.o.   MRN: [id number]  HPI patient states my foot has been hurting me for at least a month and making it hard to walk. I also have structural bunion and I like to talk to him about on both feet   Review of Systems  All other systems reviewed and are negative.      Objective:   Physical Exam  Constitutional: [name] is oriented to person, place, and time.  Cardiovascular: Intact distal pulses.   Musculoskeletal: Normal range of motion.  Neurological: [name] is oriented to person, place, and time.  Skin: Skin is warm and dry.  Nursing note and vitals reviewed.  neurovascular status found to be intact with muscle strength adequate range of motion within normal limits. Patient's noted to have moderate depression of the arch bilateral and does have hyperostosis medial aspect first metatarsal head left over right with redness and inflammation. Moderate discomfort in the plantar fashia noted on the distal portion of it as it inserts into the base of the first metatarsal with depression of the arch being a portion of the problem with this     Assessment:     Structural bunion deformity left over right with moderate depression of the arch and inflammatory fasciitis noted left    Plan:     H&P and x-rays reviewed with patient. I discussed both conditions and educated patient on the problems that [name] is experiencing and also the structural bunion deformity. Today were to focus on the acute pain and I injected the plantar fascia 3 mg Kenalog 5 mg Xylocaine and instructed on physical therapy. Patient also was dispensed fascial brace with instructions on usage and will be seen back to recheck

## 2016-02-23 ENCOUNTER — Encounter: Payer: Self-pay | Admitting: Nurse Practitioner

## 2016-06-16 ENCOUNTER — Telehealth: Payer: Self-pay | Admitting: Nurse Practitioner

## 2016-06-16 NOTE — Telephone Encounter (Signed)
LMTCB about CANCELED APPOINTMENT °

## 2016-06-21 ENCOUNTER — Encounter: Payer: Self-pay | Admitting: Cardiology

## 2016-06-28 NOTE — Progress Notes (Signed)
HPI: FU SVT. Echocardiogram in August of 2007 showed normal LV function. Stress Myoview in 2011 apparently was normal. Exercise treadmill in September of 2014; patient exercised for 9 minutes with no symptoms and normal ST segments. Since she was last seen the patient has dyspnea with more extreme activities but not with routine activities. It is relieved with rest. It is not associated with chest pain. There is no orthopnea, PND or pedal edema. There is no syncope or palpitations. There is no exertional chest pain.   Current Outpatient Prescriptions  Medication Sig Dispense Refill  . aspirin 81 MG tablet Take 81 mg by mouth daily.      . Calcium Carbonate (CALCIUM 500 PO) Take by mouth 2 (two) times daily.      . Cholecalciferol (VITAMIN D) 2000 UNITS CAPS Take by mouth daily.      Marland Kitchen. escitalopram (LEXAPRO) 10 MG tablet Take 10 mg by mouth daily.      Marland Kitchen. esomeprazole (NEXIUM) 40 MG capsule Take 20 mg by mouth daily before breakfast.     . ibandronate (BONIVA) 150 MG tablet Take in the morning with a full glass of water, on an empty stomach, and do not take anything else by mouth or lie down for the next 30 min. 3 tablet 3  . Magnesium 500 MG TABS Take 500 mg by mouth daily.    . metoprolol tartrate (LOPRESSOR) 25 MG tablet TAKE 1 TABLET TWICE A DAY 60 tablet 12  . Multiple Vitamins-Minerals (MULTIVITAMIN ADULT PO) Take 1 tablet by mouth daily.    . simvastatin (ZOCOR) 20 MG tablet Take 20 mg by mouth at bedtime.       No current facility-administered medications for this visit.      Past Medical History:  Diagnosis Date  . Anxiety   . Collagenous colitis 03/2006  . GERD (gastroesophageal reflux disease)   . Hyperlipidemia   . Osteoporosis 10/10  . Personal history of colonic polyps 03/2006   5 mm rectal adenoma  . Seasonal allergies   . Supraventricular premature beats     Past Surgical History:  Procedure Laterality Date  . COLONOSCOPY  03/2006, 07/08/2011   2007 -  collagenous colitis, 5 mm rectal adenoma 2012 - normal  . FACIAL COSMETIC SURGERY  2012  . WISDOM TOOTH EXTRACTION  age 70    Social History   Social History  . Marital status: Married    Spouse name: N/A  . Number of children: 2  . Years of education: N/A   Occupational History  . retired    Social History Main Topics  . Smoking status: Former Smoker    Packs/day: 0.50    Years: 5.00    Types: Cigarettes    Quit date: 08/29/1969  . Smokeless tobacco: Never Used  . Alcohol use 4.2 oz/week    7 Glasses of wine per week     Comment: wine at night  . Drug use: No  . Sexual activity: No   Other Topics Concern  . Not on file   Social History Narrative   Married, retired Wellsite geologistart teacher   2 daughters, 4 grandchildren   Son-in-law Dr. Aggie HackerBrian Sumner of  [facility]    Family History  Problem Relation Age of Onset  . Lung cancer Father     died at age 68  . COPD Father   . Atrial fibrillation Father   . Heart attack Paternal Grandfather   . Heart attack Maternal  Grandfather   . Hyperlipidemia Sister   . Hyperlipidemia Brother   . Hyperlipidemia Brother   . Heart attack Cousin   . Colon cancer Cousin   . Colon cancer Maternal Aunt     ROS: no fevers or chills, productive cough, hemoptysis, dysphasia, odynophagia, melena, hematochezia, dysuria, hematuria, rash, seizure activity, orthopnea, PND, pedal edema, claudication. Remaining systems are negative.  Physical Exam: Well-developed well-nourished in no acute distress.  Skin is warm and dry.  HEENT is normal.  Neck is supple.  Chest is clear to auscultation with normal expansion.  Cardiovascular exam is regular rate and rhythm.  Abdominal exam nontender or distended. No masses palpated. Extremities show no edema. neuro grossly intact  ECG-Sinus rhythm at a rate of 60. RV conduction delay. Cannot rule out prior septal infarct.  A/P  1 Hyperlipidemia-continue statin. Lipids and liver monitored by primary  care.  2 supraventricular tachycardia-beta-blockade appears to be controlling her symptoms. We will continue. If she has more frequent episodes in the future we can consider referral for ablation.  Olga MillersBrian Crenshaw, MD

## 2016-07-05 ENCOUNTER — Ambulatory Visit (INDEPENDENT_AMBULATORY_CARE_PROVIDER_SITE_OTHER): Payer: Medicare Other | Admitting: Cardiology

## 2016-07-05 ENCOUNTER — Encounter: Payer: Self-pay | Admitting: Cardiology

## 2016-07-05 VITALS — BP 104/70 | HR 60 | Ht 64.0 in | Wt 137.0 lb

## 2016-07-05 DIAGNOSIS — E7800 Pure hypercholesterolemia, unspecified: Secondary | ICD-10-CM

## 2016-07-05 DIAGNOSIS — I471 Supraventricular tachycardia: Secondary | ICD-10-CM | POA: Diagnosis not present

## 2016-07-05 NOTE — Patient Instructions (Signed)
Your physician wants you to follow-up in: ONE YEAR WITH DR CRENSHAW You will receive a reminder letter in the mail two months in advance. If you don't receive a letter, please call our office to schedule the follow-up appointment.   If you need a refill on your cardiac medications before your next appointment, please call your pharmacy.  

## 2016-07-08 ENCOUNTER — Ambulatory Visit: Payer: Medicare Other | Admitting: Nurse Practitioner

## 2016-07-26 ENCOUNTER — Other Ambulatory Visit: Payer: Self-pay | Admitting: Cardiology

## 2016-07-29 ENCOUNTER — Encounter: Payer: Self-pay | Admitting: Nurse Practitioner

## 2016-07-29 ENCOUNTER — Ambulatory Visit (INDEPENDENT_AMBULATORY_CARE_PROVIDER_SITE_OTHER): Payer: Medicare Other | Admitting: Nurse Practitioner

## 2016-07-29 VITALS — BP 116/74 | HR 64 | Ht 63.75 in | Wt 135.0 lb

## 2016-07-29 DIAGNOSIS — Z01419 Encounter for gynecological examination (general) (routine) without abnormal findings: Secondary | ICD-10-CM | POA: Diagnosis not present

## 2016-07-29 DIAGNOSIS — M810 Age-related osteoporosis without current pathological fracture: Secondary | ICD-10-CM

## 2016-07-29 DIAGNOSIS — Z0000 Encounter for general adult medical examination without abnormal findings: Secondary | ICD-10-CM

## 2016-07-29 NOTE — Patient Instructions (Signed)

## 2016-07-29 NOTE — Progress Notes (Signed)
Patient ID: [name], female   DOB: Nov 21, 1945, 70 y.o.   MRN: [id number]  70 y.o. G2P2002 Married  Caucasian Fe here for annual exam.  After last BMD was restarted on Boniva.  No new diagnosis.  Has been having more frequent diarrhea.  Now off coffee, magnesium, and Zetia with good results.  Now on Vit B 12   Patient's last menstrual period was 04/29/2006 (approximate).          Sexually active: No.  The current method of family planning is none.    Exercising: Yes.    Walking 3-4 times per week and yoga 1-2 times per week Smoker:  no  Health Maintenance: Pap: 07/08/15, Negative MMG: 02/09/16, 3D, Bi-Rads 2: Benign Findings Colonoscopy: 07/08/11, repeat 7 years BMD: 2016 with Dr. Waynard EdwardsPerini; 08/09/13 T Score: Spine: -2.7; right hip neck -1.5; left hip neck -1.5 TDaP:  2009 Shingles: 08/30/07 Pneumonia: 01/27/14 Prevnar-13 Hep C: 07/08/15 Labs: Dr. Waynard EdwardsPerini takes care of all screening labs   reports that she quit smoking about 46 years ago. Her smoking use included Cigarettes. She has a 2.50 pack-year smoking history. She has never used smokeless tobacco. She reports that she drinks about 4.2 oz of alcohol per week . She reports that she does not use drugs.  Past Medical History:  Diagnosis Date  . Anxiety   . Collagenous colitis 03/2006  . GERD (gastroesophageal reflux disease)   . Hyperlipidemia   . Osteoporosis 10/10  . Personal history of colonic polyps 03/2006   5 mm rectal adenoma  . Seasonal allergies   . Supraventricular premature beats     Past Surgical History:  Procedure Laterality Date  . COLONOSCOPY  03/2006, 07/08/2011   2007 - collagenous colitis, 5 mm rectal adenoma 2012 - normal  . FACIAL COSMETIC SURGERY  2012  . WISDOM TOOTH EXTRACTION  age 70    Current Outpatient Prescriptions  Medication Sig Dispense Refill  . aspirin 81 MG tablet Take 81 mg by mouth daily.      . Calcium Carbonate (CALCIUM 500 PO) Take by mouth 2 (two) times daily.      . Cholecalciferol  (VITAMIN D) 2000 UNITS CAPS Take by mouth daily.      Marland Kitchen. escitalopram (LEXAPRO) 10 MG tablet Take 10 mg by mouth daily.      Marland Kitchen. esomeprazole (NEXIUM) 40 MG capsule Take 20 mg by mouth daily before breakfast.     . ibandronate (BONIVA) 150 MG tablet Take in the morning with a full glass of water, on an empty stomach, and do not take anything else by mouth or lie down for the next 30 min. 3 tablet 3  . Magnesium 500 MG TABS Take 500 mg by mouth daily.    . metoprolol tartrate (LOPRESSOR) 25 MG tablet Take 1 tablet (25 mg total) by mouth 2 (two) times daily. 60 tablet 11  . Multiple Vitamins-Minerals (MULTIVITAMIN ADULT PO) Take 1 tablet by mouth daily.    . simvastatin (ZOCOR) 20 MG tablet Take 20 mg by mouth at bedtime.       No current facility-administered medications for this visit.     Family History  Problem Relation Age of Onset  . Lung cancer Father     died at age 68  . COPD Father   . Atrial fibrillation Father   . Heart attack Paternal Grandfather   . Heart attack Maternal Grandfather   . Hyperlipidemia Sister   . Hyperlipidemia Brother   . Hyperlipidemia  Brother   . Heart attack Cousin   . Colon cancer Cousin   . Colon cancer Maternal Aunt     ROS:  Pertinent items are noted in HPI.  Otherwise, a comprehensive ROS was negative.  Exam:   LMP 04/29/2006 (Approximate)    Ht Readings from Last 3 Encounters:  07/05/16 5\' 4"  (1.626 m)  07/17/15 5\' 4"  (1.626 m)  07/08/15 5\' 4"  (1.626 m)    General appearance: alert, cooperative and appears stated age Head: Normocephalic, without obvious abnormality, atraumatic Neck: no adenopathy, supple, symmetrical, trachea midline and thyroid normal to inspection and palpation Lungs: clear to auscultation bilaterally Breasts: normal appearance, no masses or tenderness Heart: regular rate and rhythm Abdomen: soft, non-tender; no masses,  no organomegaly Extremities: extremities normal, atraumatic, no cyanosis or edema Skin: Skin  color, texture, turgor normal. No rashes or lesions Lymph nodes: Cervical, supraclavicular, and axillary nodes normal. No abnormal inguinal nodes palpated Neurologic: Grossly normal   Pelvic: External genitalia:  no lesions              Urethra:  normal appearing urethra with no masses, tenderness or lesions              Bartholin's and Skene's: normal                 Vagina: normal appearing vagina with normal color and discharge, no lesions              Cervix: anteverted              Pap taken: No. Bimanual Exam:  Uterus:  normal size, contour, position, consistency, mobility, non-tender              Adnexa: no mass, fullness, tenderness               Rectovaginal: Confirms               Anus:  normal sphincter tone, no lesions  Chaperone present: no  A:  Well Woman with normal exam  Postmenopausal no HRT other than a few months initially Osteoporosis on Boniva since 2010, then a break in 2014.  Back on Boniva for a short time in 2015 - now back on since fall 2016 History of heart palpitations - SVT             Cystocele with pelvic floor relaxation   P:   Reviewed health and wellness pertinent to exam  Pap smear as above  Mammogram is due 01/2017  ROI for  BMD at PCP  Counseled on breast self exam, mammography screening, adequate intake of calcium and vitamin D, diet and exercise, Kegel's exercises return annually or prn  An After Visit Summary was printed and given to the patient.

## 2016-08-02 NOTE — Progress Notes (Signed)
Encounter reviewed by Dr. Brook Amundson C. Silva.  

## 2016-08-16 ENCOUNTER — Telehealth: Payer: Self-pay | Admitting: Nurse Practitioner

## 2016-08-16 NOTE — Telephone Encounter (Signed)
Review of BMD results from 08/26/2015.  The T Score at the spine is -3.1; the right hip neck -2.1; left hip neck -1.8.  Osteoporosis at the spine and Osteopenia at the hips.  In comparison from 2014 there is a decrease at the spine and both hips.  She is back on Boniva since fall 2016.

## 2016-08-18 NOTE — Addendum Note (Signed)
Addended by: Luisa DagoPHILLIPS, STEPHANIE C on: 08/18/2016 09:57 AM   Modules accepted: Orders

## 2016-09-19 ENCOUNTER — Encounter: Payer: Self-pay | Admitting: Internal Medicine

## 2016-10-26 ENCOUNTER — Encounter (INDEPENDENT_AMBULATORY_CARE_PROVIDER_SITE_OTHER): Payer: Self-pay

## 2016-10-26 ENCOUNTER — Encounter: Payer: Self-pay | Admitting: Internal Medicine

## 2016-10-26 ENCOUNTER — Ambulatory Visit (INDEPENDENT_AMBULATORY_CARE_PROVIDER_SITE_OTHER): Payer: Medicare Other | Admitting: Internal Medicine

## 2016-10-26 ENCOUNTER — Other Ambulatory Visit: Payer: Medicare Other

## 2016-10-26 ENCOUNTER — Other Ambulatory Visit: Payer: Self-pay | Admitting: Internal Medicine

## 2016-10-26 VITALS — BP 100/70 | HR 64 | Ht 63.75 in | Wt 133.4 lb

## 2016-10-26 DIAGNOSIS — R1319 Other dysphagia: Secondary | ICD-10-CM

## 2016-10-26 DIAGNOSIS — K580 Irritable bowel syndrome with diarrhea: Secondary | ICD-10-CM

## 2016-10-26 DIAGNOSIS — K52831 Collagenous colitis: Secondary | ICD-10-CM

## 2016-10-26 DIAGNOSIS — R1310 Dysphagia, unspecified: Secondary | ICD-10-CM | POA: Diagnosis not present

## 2016-10-26 NOTE — Progress Notes (Addendum)
[name] 70 y.o. [birth year] [id number]  Assessment & Plan:   Encounter Diagnoses  Name Primary?  . Collagenous colitis   . Irritable bowel syndrome with diarrhea   . Esophageal dysphagia to liquids Yes    It sounds like she has been having problems with irritable bowel syndrome more than anything though she does have this history of collagenous colitis on colonoscopy and biopsy in the past. She responded to metronidazole it seems which raises the question of some small intestinal bacterial overgrowth.  Regarding her dysphagia to liquids it's probably an esophageal motility disturbance. I'm going to have her do [name] barium swallow with tablet to evaluate  Also screen for celiac disease as that is associated with collagenous colitis. Considerations for that would be to consider whether or not she would need to change medications or drinker S citalopram is [name] risk as well as pantoprazole though much less likely on the pantoprazole.  Will then regroup and decide if she would need EGD, medication additions, and for IBS sxs vs collagenous colitis ? Needs disease specific Tx for that vs IBS tx or both   Options would be budesonide, prednisone vs sxic tx w/ loperamide , benefiber  Next time for routine colonoscopy would be earliest in 2019 but could possibly go to 2022 since low-risk adenoma only 1x at initial colonoscopy and also went updated guidelines suggest [name] 10 year follow-up after her last colonoscopy. Signs or symptoms could impact this though right now not inclined to go for [name] colonoscopy.  I appreciate the opportunity to care for this patient. CC: [name],[name] A, MD    Subjective:   Chief Complaint: diarrhea  HPI [name] is here, sent back by Dr. Waynard EdwardsPerini, with diarrhea problems. She also has difficulty swallowing cold water or liquids at times. She has [name] history of collagenous colitis diagnosed at prior colonoscopy, though she has never required disease specific therapy, she  has managed it with loperamide. It sounds like that was not [name] problem for [name] while but sometime in the past year she's been having intermittent incomplete defecation and stools that vary from formed to loose and maybe even watery. Nothing persistent but it got to the point where she mentioned it to Dr. Waynard EdwardsPerini, I think it was worse and last month he prescribed 10 days of metronidazole. She is also taking align probiotic. She says that after that she has felt better at least for the last several days. He has used some Imodium as well. Dr. Waynard EdwardsPerini had her stop magnesium + simvistatin and she feels like the diarrhea was better with that as well. There is no suggest that it was not and that's when he prescribed to metronidazole. She has also tried Lactaid She is not having any rectal bleeding, no significant abdominal pain, fevers reported.  When she drinks cold liquids I guess mainly water she'll have [name] choking and [name] spasm and it will go down well. No problem with pills or solid food. No heartburn symptoms to any great degree on her pantoprazole.  Allergies  Allergen Reactions  . Bee Venom Other (See Comments)    Yellow jacket venom  . Epinephrine     REACTION: heart flutter  . Neosporin [Neomycin-Polymyxin-Gramicidin] Itching    Uses bacotracin   Current Meds  Medication Sig  . aspirin 81 MG tablet Take 81 mg by mouth daily.    . Cholecalciferol (VITAMIN D) 2000 UNITS CAPS Take by mouth daily.    . Cyanocobalamin (VITAMIN B 12)  100 MCG LOZG Take by mouth.  . escitalopram (LEXAPRO) 20 MG tablet daily.  Marland Kitchen ibandronate (BONIVA) 150 MG tablet Take in the morning with [name] full glass of water, on an empty stomach, and do not take anything else by mouth or lie down for the next 30 min. (Patient taking differently: every 30 (thirty) days. Take in the morning with [name] full glass of water, on an empty stomach, and do not take anything else by mouth or lie down for the next 30 min.)  . lactase (LACTAID) 3000 units  tablet Take by mouth as needed.  . metoprolol tartrate (LOPRESSOR) 25 MG tablet Take 1 tablet (25 mg total) by mouth 2 (two) times daily.  . pantoprazole (PROTONIX) 40 MG tablet Take 1 tablet by mouth daily.  . Probiotic Product (ALIGN PO) Take by mouth daily.  . simvastatin (ZOCOR) 40 MG tablet Take 1 tablet by mouth daily.   Past Medical History:  Diagnosis Date  . Anxiety   . Bunion   . Collagenous colitis 03/2006  . GERD (gastroesophageal reflux disease)   . Hyperlipidemia   . Osteoporosis 10/10  . Personal history of colonic polyps 03/2006   5 mm rectal adenoma  . Seasonal allergies   . Supraventricular premature beats    Past Surgical History:  Procedure Laterality Date  . COLONOSCOPY  03/2006, 07/08/2011   2007 - collagenous colitis, 5 mm rectal adenoma 2012 - normal  . FACIAL COSMETIC SURGERY  2012  . WISDOM TOOTH EXTRACTION  age 24   Social History   Social History  . Marital status: Married    Spouse name: Rosanne Ashing  . Number of children: 2  . Years of education: N/[name]   Occupational History  . retired    Social History Main Topics  . Smoking status: Former Smoker    Packs/day: 0.50    Years: 5.00    Types: Cigarettes    Quit date: 08/29/1969  . Smokeless tobacco: Never Used  . Alcohol use 4.2 oz/week    7 Glasses of wine per week     Comment: wine at night  . Drug use: No  . Sexual activity: No   Other Topics Concern  . None   Social History Narrative   Married, retired Wellsite geologist   2 daughters, 4 grandchildren   Son-in-law Dr. Aggie Hacker of  [facility]   family history includes Atrial fibrillation in her father; COPD in her father; Colon cancer in her cousin and maternal aunt; Heart attack in her cousin, maternal grandfather, and paternal grandfather; Hyperlipidemia in her brother, brother, and sister; Lung cancer in her father; Schizophrenia in her sister; Vascular Disease in her mother.  Review of Systems As above. Denies other problems at this  time all other review of systems are negative.  Objective:   Physical Exam @BP  100/70   Pulse 64   Ht 5' 3.75" (1.619 m)   Wt 133 lb 6 oz (60.5 kg)   LMP 04/29/2006 (Approximate)   BMI 23.07 kg/m @  General:  Well-developed, well-nourished and in no acute distress Eyes:  anicteric. ENT:   Mouth and posterior pharynx free of lesions.  Neck:   supple w/o thyromegaly or mass.  Lungs: Clear to auscultation bilaterally. Heart:  S1S2, no rubs, murmurs, gallops. Abdomen:  soft, non-tender, no hepatosplenomegaly, hernia, or mass and BS+.  Lymph:  no cervical or supraclavicular adenopathy. Extremities:   no edema, cyanosis or clubbing Skin   no rash. Neuro:  [name]&O x  3.  Psych:  appropriate mood and  Affect.   Data Reviewed: Prior colonoscopy and pathology 2007  2012 Recent primary care notes 09/19/2016

## 2016-10-26 NOTE — Patient Instructions (Signed)
   Your physician has requested that you go to the basement for the following lab work before leaving today: TTG, IGA    [facility] at 10:00AM. Please arrive 15 minutes prior to your appointment for registration. Make certain not to have anything to eat or drink 6 hours prior to your test. If you need to reschedule for any reason, please contact radiology at [phone] to do so. __________________________________________________________________ A barium swallow is an examination that concentrates on views of the esophagus. This tends to be a double contrast exam (barium and two liquids which, when combined, create a gas to distend the wall of the oesophagus) or single contrast (non-ionic iodine based). The study is usually tailored to your symptoms so a good history is essential. Attention is paid during the study to the form, structure and configuration of the esophagus, looking for functional disorders (such as aspiration, dysphagia, achalasia, motility and reflux) EXAMINATION You may be asked to change into a gown, depending on the type of swallow being performed. A radiologist and radiographer will perform the procedure. The radiologist will advise you of the type of contrast selected for your procedure and direct you during the exam. You will be asked to stand, sit or lie in several different positions and to hold a small amount of fluid in your mouth before being asked to swallow while the imaging is performed .In some instances you may be asked to swallow barium coated marshmallows to assess the motility of a solid food bolus. The exam can be recorded as a digital or video fluoroscopy procedure. POST PROCEDURE It will take 1-2 days for the barium to pass through your system. To facilitate this, it is important, unless otherwise directed, to increase your fluids for the next 24-48hrs and to resume your normal diet.  This  test typically takes about 30 minutes to perform. __________________________________________________________________________________   We will call you with results and plans.    I appreciate the opportunity to care for you. Stan Headarl Gessner, MD, [facility]

## 2016-10-27 LAB — IGG: IgG (Immunoglobin G), Serum: 1045 mg/dL (ref 694–1618)

## 2016-10-27 LAB — TISSUE TRANSGLUTAMINASE, IGA: TISSUE TRANSGLUTAMINASE AB, IGA: 1 U/mL (ref <4)

## 2016-10-27 NOTE — Progress Notes (Signed)
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

## 2016-10-28 LAB — IGA: IgA: 195 mg/dL (ref 81–463)

## 2016-11-02 ENCOUNTER — Ambulatory Visit (HOSPITAL_COMMUNITY)
Admission: RE | Admit: 2016-11-02 | Discharge: 2016-11-02 | Disposition: A | Discharge status: Home / Self Care | Payer: Medicare Other | Source: Ambulatory Visit | Attending: Internal Medicine | Admitting: Internal Medicine

## 2016-11-02 DIAGNOSIS — R197 Diarrhea, unspecified: Secondary | ICD-10-CM | POA: Insufficient documentation

## 2016-11-02 DIAGNOSIS — R1310 Dysphagia, unspecified: Secondary | ICD-10-CM | POA: Diagnosis not present

## 2016-11-02 DIAGNOSIS — R1319 Other dysphagia: Secondary | ICD-10-CM

## 2016-11-03 NOTE — Progress Notes (Signed)
I called results Dysphagia is to cold liquids only - she will avoid those She wants to see if she can come off PPI - will try to taper off and use prn antacids Thinks diarrhea sxs still better after metronidazole Tx by dr. Waynard Edwardsperini I asked her to call me back with any ?'s or concerns and especially if gets persistent diarrhea sxs would consider a course of budesonide since she has bx proven collagenous colitis

## 2016-11-03 NOTE — Progress Notes (Signed)
IgA NL so no celiac disease - patient informed

## 2017-03-29 ENCOUNTER — Encounter: Payer: Self-pay | Admitting: Obstetrics and Gynecology

## 2017-07-28 ENCOUNTER — Other Ambulatory Visit: Payer: Self-pay | Admitting: Cardiology

## 2017-08-01 ENCOUNTER — Ambulatory Visit: Payer: Medicare Other | Admitting: Nurse Practitioner

## 2017-08-03 NOTE — Progress Notes (Signed)
HPI: FU SVT. Echocardiogram in August of 2007 showed normal LV function. Stress Myoview in 2011 apparently was normal. Exercise treadmill in September of 2014; patient exercised for 9 minutes with no symptoms and normal ST segments. Since she was last seen she has an occasional brief flutter but no sustained palpitations. No dyspnea, chest pain or syncope.  Current Outpatient Medications  Medication Sig Dispense Refill  . aspirin 81 MG tablet Take 81 mg by mouth daily.      . Cholecalciferol (VITAMIN D) 2000 UNITS CAPS Take by mouth daily.      . Cyanocobalamin (VITAMIN B 12) 100 MCG LOZG Take by mouth.    . escitalopram (LEXAPRO) 20 MG tablet daily.    Marland Kitchen. ibandronate (BONIVA) 150 MG tablet Take in the morning with a full glass of water, on an empty stomach, and do not take anything else by mouth or lie down for the next 30 min. (Patient taking differently: every 30 (thirty) days. Take in the morning with a full glass of water, on an empty stomach, and do not take anything else by mouth or lie down for the next 30 min.) 3 tablet 3  . lactase (LACTAID) 3000 units tablet Take by mouth as needed.    . metoprolol tartrate (LOPRESSOR) 25 MG tablet TAKE 1 TABLET (25 MG TOTAL) BY MOUTH 2 (TWO) TIMES DAILY. 60 tablet 11  . pantoprazole (PROTONIX) 40 MG tablet Take 1 tablet by mouth daily.    . Probiotic Product (ALIGN PO) Take by mouth daily.    . simvastatin (ZOCOR) 40 MG tablet Take 1 tablet by mouth daily.     No current facility-administered medications for this visit.      Past Medical History:  Diagnosis Date  . Anxiety   . Bunion   . Collagenous colitis 03/2006  . GERD (gastroesophageal reflux disease)   . Hyperlipidemia   . Osteoporosis 10/10  . Personal history of colonic polyps 03/2006   5 mm rectal adenoma  . Seasonal allergies   . Supraventricular premature beats     Past Surgical History:  Procedure Laterality Date  . COLONOSCOPY  03/2006, 07/08/2011   2007 - collagenous  colitis, 5 mm rectal adenoma 2012 - normal  . FACIAL COSMETIC SURGERY  2012  . WISDOM TOOTH EXTRACTION  age 71    Social History   Socioeconomic History  . Marital status: Married    Spouse name: Rosanne AshingJim  . Number of children: 2  . Years of education: Not on file  . Highest education level: Not on file  Social Needs  . Financial resource strain: Not on file  . Food insecurity - worry: Not on file  . Food insecurity - inability: Not on file  . Transportation needs - medical: Not on file  . Transportation needs - non-medical: Not on file  Occupational History  . Occupation: retired  Tobacco Use  . Smoking status: Former Smoker    Packs/day: 0.50    Years: 5.00    Pack years: 2.50    Types: Cigarettes    Last attempt to quit: 08/29/1969    Years since quitting: 47.9  . Smokeless tobacco: Never Used  Substance and Sexual Activity  . Alcohol use: Yes    Alcohol/week: 4.2 oz    Types: 7 Glasses of wine per week    Comment: wine at night  . Drug use: No  . Sexual activity: No    Partners: Male    Birth  control/protection: Post-menopausal  Other Topics Concern  . Not on file  Social History Narrative   Married, retired Wellsite geologistart teacher   2 daughters, 4 grandchildren   Son-in-law Dr. Aggie HackerBrian Sumner of  [facility]    Family History  Problem Relation Age of Onset  . Lung cancer Father        died at age 69, was a smoker  . COPD Father   . Atrial fibrillation Father   . Heart attack Paternal Grandfather   . Heart attack Maternal Grandfather   . Vascular Disease Mother   . Hyperlipidemia Sister   . Schizophrenia Sister   . Hyperlipidemia Brother   . Hyperlipidemia Brother   . Heart attack Cousin   . Colon cancer Cousin   . Colon cancer Maternal Aunt   . Diabetes Neg Hx     ROS: no fevers or chills, productive cough, hemoptysis, dysphasia, odynophagia, melena, hematochezia, dysuria, hematuria, rash, seizure activity, orthopnea, PND, pedal edema, claudication. Remaining  systems are negative.  Physical Exam: Well-developed well-nourished in no acute distress.  Skin is warm and dry.  HEENT is normal.  Neck is supple.  Chest is clear to auscultation with normal expansion.  Cardiovascular exam is regular rate and rhythm.  Abdominal exam nontender or distended. No masses palpated. Extremities show no edema. neuro grossly intact  ECG- sinus rhythm, left anterior fascicular block, RV conduction delay, cannot rule out septal infarct.personally reviewed  A/P  1 supraventricular tachycardia-patient is doing well with no recurrent episodes. Continue beta blocker. We will consider referral for ablation in the future for symptoms refractory to medical therapy.  2 hyperlipidemia-continue statin. Lipids and liver are monitored by primary care.  Olga MillersBrian Crenshaw, MD

## 2017-08-10 ENCOUNTER — Encounter: Payer: Self-pay | Admitting: Cardiology

## 2017-08-10 ENCOUNTER — Ambulatory Visit (INDEPENDENT_AMBULATORY_CARE_PROVIDER_SITE_OTHER): Payer: Medicare Other | Admitting: Cardiology

## 2017-08-10 VITALS — BP 104/68 | HR 73 | Ht 63.75 in | Wt 136.0 lb

## 2017-08-10 DIAGNOSIS — E7800 Pure hypercholesterolemia, unspecified: Secondary | ICD-10-CM

## 2017-08-10 DIAGNOSIS — I471 Supraventricular tachycardia: Secondary | ICD-10-CM

## 2017-08-10 MED ORDER — METOPROLOL TARTRATE 25 MG PO TABS: 25.0000 mg | ORAL_TABLET | Freq: Two times a day (BID) | ORAL | 3 refills | Status: DC

## 2017-08-10 NOTE — Patient Instructions (Signed)
Your physician wants you to follow-up in: ONE YEAR WITH DR CRENSHAW You will receive a reminder letter in the mail two months in advance. If you don't receive a letter, please call our office to schedule the follow-up appointment.   If you need a refill on your cardiac medications before your next appointment, please call your pharmacy.  

## 2017-08-11 ENCOUNTER — Other Ambulatory Visit: Payer: Self-pay

## 2017-08-11 ENCOUNTER — Encounter: Payer: Self-pay | Admitting: Certified Nurse Midwife

## 2017-08-11 ENCOUNTER — Ambulatory Visit (INDEPENDENT_AMBULATORY_CARE_PROVIDER_SITE_OTHER): Payer: Medicare Other | Admitting: Certified Nurse Midwife

## 2017-08-11 VITALS — BP 118/66 | HR 76 | Resp 16 | Ht 63.75 in | Wt 134.0 lb

## 2017-08-11 DIAGNOSIS — N951 Menopausal and female climacteric states: Secondary | ICD-10-CM

## 2017-08-11 DIAGNOSIS — N952 Postmenopausal atrophic vaginitis: Secondary | ICD-10-CM

## 2017-08-11 DIAGNOSIS — Z01419 Encounter for gynecological examination (general) (routine) without abnormal findings: Secondary | ICD-10-CM | POA: Diagnosis not present

## 2017-08-11 NOTE — Patient Instructions (Signed)
EXERCISE AND DIET:  We recommended that you start or continue a regular exercise program for good health. Regular exercise means any activity that makes your heart beat faster and makes you sweat.  We recommend exercising at least 30 minutes per day at least 3 days a week, preferably 4 or 5.  We also recommend a diet low in fat and sugar.  Inactivity, poor dietary choices and obesity can cause diabetes, heart attack, stroke, and kidney damage, among others.    ALCOHOL AND SMOKING:  Women should limit their alcohol intake to no more than 7 drinks/beers/glasses of wine (combined, not each!) per week. Moderation of alcohol intake to this level decreases your risk of breast cancer and liver damage. And of course, no recreational drugs are part of a healthy lifestyle.  And absolutely no smoking or even second hand smoke. Most people know smoking can cause heart and lung diseases, but did you know it also contributes to weakening of your bones? Aging of your skin?  Yellowing of your teeth and nails?  CALCIUM AND VITAMIN D:  Adequate intake of calcium and Vitamin D are recommended.  The recommendations for exact amounts of these supplements seem to change often, but generally speaking 600 mg of calcium (either carbonate or citrate) and 800 units of Vitamin D per day seems prudent. Certain women may benefit from higher intake of Vitamin D.  If you are among these women, your doctor will have told you during your visit.    PAP SMEARS:  Pap smears, to check for cervical cancer or precancers,  have traditionally been done yearly, although recent scientific advances have shown that most women can have pap smears less often.  However, every woman still should have a physical exam from her gynecologist every year. It will include a breast check, inspection of the vulva and vagina to check for abnormal growths or skin changes, a visual exam of the cervix, and then an exam to evaluate the size and shape of the uterus and  ovaries.  And after 69 years of age, a rectal exam is indicated to check for rectal cancers. We will also provide age appropriate advice regarding health maintenance, like when you should have certain vaccines, screening for sexually transmitted diseases, bone density testing, colonoscopy, mammograms, etc.   MAMMOGRAMS:  All women over 40 years old should have a yearly mammogram. Many facilities now offer a "3D" mammogram, which may cost around $50 extra out of pocket. If possible,  we recommend you accept the option to have the 3D mammogram performed.  It both reduces the number of women who will be called back for extra views which then turn out to be normal, and it is better than the routine mammogram at detecting truly abnormal areas.    COLONOSCOPY:  Colonoscopy to screen for colon cancer is recommended for all women at age 50.  We know, you hate the idea of the prep.  We agree, BUT, having colon cancer and not knowing it is worse!!  Colon cancer so often starts as a polyp that can be seen and removed at colonscopy, which can quite literally save your life!  And if your first colonoscopy is normal and you have no family history of colon cancer, most women don't have to have it again for 10 years.  Once every ten years, you can do something that may end up saving your life, right?  We will be happy to help you get it scheduled when you are ready.    Be sure to check your insurance coverage so you understand how much it will cost.  It may be covered as a preventative service at no cost, but you should check your particular policy.      Atrophic Vaginitis Atrophic vaginitis is when the tissues that line the vagina become dry and thin. This is caused by a drop in estrogen. Estrogen helps:  To keep the vagina moist.  To make a clear fluid that helps: ? To lubricate the vagina for sex. ? To protect the vagina from infection.  If the lining of the vagina is dry and thin, it may:  Make sex painful. It  may also cause bleeding.  Cause a feeling of: ? Burning. ? Irritation. ? Itchiness.  Make an exam of your vagina painful. It may also cause bleeding.  Make you lose interest in sex.  Cause a burning feeling when you pee.  Make your vaginal fluid (discharge) brown or yellow.  For some women, there are no symptoms. This condition is most common in women who do not get their regular menstrual periods anymore (menopause). This often starts when a woman is 45-55 years old. Follow these instructions at home:  Take medicines only as told by your doctor. Do not use any herbal or alternative medicines unless your doctor says it is okay.  Use over-the-counter products for dryness only as told by your doctor. These include: ? Creams. ? Lubricants. ? Moisturizers.  Do not douche.  Do not use products that can make your vagina dry. These include: ? Scented feminine sprays. ? Scented tampons. ? Scented soaps.  If it hurts to have sex, tell your sexual partner. Contact a doctor if:  Your discharge looks different than normal.  Your vagina has an unusual smell.  You have new symptoms.  Your symptoms do not get better with treatment.  Your symptoms get worse. This information is not intended to replace advice given to you by your health care provider. Make sure you discuss any questions you have with your health care provider. Document Released: 02/01/2008 Document Revised: 01/21/2016 Document Reviewed: 08/06/2014 Elsevier Interactive Patient Education  2018 Elsevier Inc.  

## 2017-08-11 NOTE — Progress Notes (Signed)
71 y.o. [id number] Married  Caucasian Fe here for annual exam. Menopausal no HRT. Denies vaginal bleeding or vaginal dryness. Sees Dr. Waynard EdwardsPerini for aex and labs and medication management of cholesterol, hypertension,anxiety, GERD, Vitamin D/osteoporosis. All stable at this point. Will be having BMD this year to assess change. Staying active with exercise. No health issues today.  Patient's last menstrual period was 04/29/2006 (approximate).          Sexually active: No.  The current method of family planning is post menopausal status.    Exercising: Yes.    walking Smoker:  no  Health Maintenance: Pap:  07-08-15 neg History of Abnormal Pap:? just a repeat done MMG:  02-09-17 category c density birads 1:neg Self Breast exams: no Colonoscopy:  2012 f/u 2050yrs due in 2019 BMD:   2016 osteoporosis spine, osteopenia hips TDaP:  Within 7678yrs Shingles: 2009 Pneumonia: 2015 Hep C and HIV: hep c neg 2016 Labs: no   reports that she quit smoking about 47 years ago. Her smoking use included cigarettes. She has a 2.50 pack-year smoking history. she has never used smokeless tobacco. She reports that she drinks about 4.2 oz of alcohol per week. She reports that she does not use drugs.  Past Medical History:  Diagnosis Date  . Anxiety   . Bunion   . Collagenous colitis 03/2006  . GERD (gastroesophageal reflux disease)   . Hyperlipidemia   . Osteoporosis 10/10  . Personal history of colonic polyps 03/2006   5 mm rectal adenoma  . Seasonal allergies   . Supraventricular premature beats     Past Surgical History:  Procedure Laterality Date  . COLONOSCOPY  03/2006, 07/08/2011   2007 - collagenous colitis, 5 mm rectal adenoma 2012 - normal  . FACIAL COSMETIC SURGERY  2012  . WISDOM TOOTH EXTRACTION  age 71    Current Outpatient Medications  Medication Sig Dispense Refill  . aspirin 81 MG tablet Take 81 mg by mouth daily.      . Cholecalciferol (VITAMIN D) 2000 UNITS CAPS Take by mouth daily.      .  Cyanocobalamin (VITAMIN B 12) 100 MCG LOZG Take by mouth.    . escitalopram (LEXAPRO) 20 MG tablet daily.    Marland Kitchen. ibandronate (BONIVA) 150 MG tablet Take in the morning with a full glass of water, on an empty stomach, and do not take anything else by mouth or lie down for the next 30 min. (Patient taking differently: every 30 (thirty) days. Take in the morning with a full glass of water, on an empty stomach, and do not take anything else by mouth or lie down for the next 30 min.) 3 tablet 3  . lactase (LACTAID) 3000 units tablet Take by mouth as needed.    . metoprolol tartrate (LOPRESSOR) 25 MG tablet Take 1 tablet (25 mg total) by mouth 2 (two) times daily. 180 tablet 3  . pantoprazole (PROTONIX) 40 MG tablet Take 1 tablet by mouth daily.    . Probiotic Product (ALIGN PO) Take by mouth daily.    . simvastatin (ZOCOR) 40 MG tablet Take 1 tablet by mouth daily.     No current facility-administered medications for this visit.     Family History  Problem Relation Age of Onset  . Lung cancer Father        died at age 69, was a smoker  . COPD Father   . Atrial fibrillation Father   . Heart attack Paternal Grandfather   . Heart  attack Maternal Grandfather   . Vascular Disease Mother   . Hyperlipidemia Sister   . Schizophrenia Sister   . Hyperlipidemia Brother   . Hyperlipidemia Brother   . Heart attack Cousin   . Colon cancer Cousin   . Colon cancer Maternal Aunt   . Diabetes Neg Hx     ROS:  Pertinent items are noted in HPI.  Otherwise, a comprehensive ROS was negative.  Exam:   LMP 04/29/2006 (Approximate)    Ht Readings from Last 3 Encounters:  08/10/17 5' 3.75" (1.619 m)  10/26/16 5' 3.75" (1.619 m)  07/29/16 5' 3.75" (1.619 m)    General appearance: alert, cooperative and appears stated age Head: Normocephalic, without obvious abnormality, atraumatic Neck: no adenopathy, supple, symmetrical, trachea midline and thyroid normal to inspection and palpation Lungs: clear to  auscultation bilaterally Breasts: Inspection negative, No nipple retraction or dimpling, No nipple discharge or bleeding, No axillary or supraclavicular adenopathy Heart: regular rate and rhythm Abdomen: soft, non-tender; no masses,  no organomegaly Extremities: extremities normal, atraumatic, no cyanosis or edema Skin: Skin color, texture, turgor normal. No rashes or lesions Lymph nodes: Cervical, supraclavicular, and axillary nodes normal. No abnormal inguinal nodes palpated Neurologic: Grossly normal   Pelvic: External genitalia:  no lesions              Urethra:  normal appearing urethra with no masses, tenderness or lesions              Bartholin's and Skene's: normal                 Vagina: normal appearing vagina with normal color and discharge, no lesions              Cervix: multiparous appearance and no lesions              Pap taken: No. Bimanual Exam:  Uterus:  normal size, contour, position, consistency, mobility, non-tender              Adnexa: normal adnexa and no mass, fullness, tenderness               Rectovaginal: Confirms               Anus:  normal sphincter tone, no lesions  Chaperone present: yes  A:  Well Woman with normal exam  Menopausal no HRT  Atrophic vaginitis  Hypertension/cholesterol/GERD/Osteoporosis management with PCP  Family history of colon cancer, cousin  P:   Reviewed health and wellness pertinent to exam  Discussed need to advise if vaginal bleeding  Discussed finding and treatment options if desired. Patient may try OTC and will advise if she feels needed  Continue follow up with PCP as indicated  Be sure and follow up with colonoscopy in the next year  Pap smear: no   counseled on breast self exam, mammography screening, feminine hygiene, adequate intake of calcium and vitamin D, diet and exercise, Kegel's exercises  return annually or prn  An After Visit Summary was printed and given to the patient.

## 2017-08-12 ENCOUNTER — Encounter: Payer: Self-pay | Admitting: Certified Nurse Midwife

## 2018-02-09 ENCOUNTER — Telehealth: Payer: Self-pay | Admitting: Internal Medicine

## 2018-02-09 MED ORDER — DIPHENOXYLATE-ATROPINE 2.5-0.025 MG PO TABS: 1.0000 | ORAL_TABLET | Freq: Four times a day (QID) | ORAL | 0 refills | Status: DC | PRN

## 2018-02-09 MED ORDER — BUDESONIDE 3 MG PO CPEP: 9.0000 mg | ORAL_CAPSULE | Freq: Every day | ORAL | 1 refills | Status: DC

## 2018-02-09 NOTE — Telephone Encounter (Signed)
Flare of diarrhea  Started after an Abx "mycin" but has been coming and going  Has nocturnal stools also  She has collagenous colitis  Will rx budesonide and Lomotil  Imodium not working

## 2018-03-06 NOTE — Telephone Encounter (Signed)
Pt states medications have helped with the diarrhea and is feeling much better.

## 2018-08-08 NOTE — Progress Notes (Deleted)
HPI: FU SVT. Echocardiogram in August of 2007 showed normal LV function. Stress Myoview in 2011 apparently was normal. Exercise treadmill in September of 2014; patient exercised for 9 minutes with no symptoms and normal ST segments. Since she was last seen   Current Outpatient Medications  Medication Sig Dispense Refill  . aspirin 81 MG tablet Take 81 mg by mouth daily.      . budesonide (ENTOCORT EC) 3 MG 24 hr capsule Take 3 capsules (9 mg total) by mouth daily. 90 capsule 1  . Cholecalciferol (VITAMIN D PO) Take by mouth.    . Cyanocobalamin (VITAMIN B 12) 100 MCG LOZG Take by mouth.    . diphenoxylate-atropine (LOMOTIL) 2.5-0.025 MG tablet Take 1 tablet by mouth 4 (four) times daily as needed for diarrhea or loose stools. 60 tablet 0  . escitalopram (LEXAPRO) 20 MG tablet daily.    Marland Kitchen ibandronate (BONIVA) 150 MG tablet Take in the morning with a full glass of water, on an empty stomach, and do not take anything else by mouth or lie down for the next 30 min. (Patient taking differently: every 30 (thirty) days. Take in the morning with a full glass of water, on an empty stomach, and do not take anything else by mouth or lie down for the next 30 min.) 3 tablet 3  . metoprolol tartrate (LOPRESSOR) 25 MG tablet Take 1 tablet (25 mg total) by mouth 2 (two) times daily. 180 tablet 3  . pantoprazole (PROTONIX) 40 MG tablet Take 1 tablet by mouth every other day.     . simvastatin (ZOCOR) 40 MG tablet Take 1 tablet by mouth daily.     No current facility-administered medications for this visit.      Past Medical History:  Diagnosis Date  . Anxiety   . Bunion   . Collagenous colitis 03/2006  . GERD (gastroesophageal reflux disease)   . Hyperlipidemia   . Osteoporosis 10/10  . Personal history of colonic polyps 03/2006   5 mm rectal adenoma  . Seasonal allergies   . Supraventricular premature beats     Past Surgical History:  Procedure Laterality Date  . COLONOSCOPY  03/2006,  07/08/2011   2007 - collagenous colitis, 5 mm rectal adenoma 2012 - normal  . FACIAL COSMETIC SURGERY  2012  . WISDOM TOOTH EXTRACTION  age 54    Social History   Socioeconomic History  . Marital status: Married    Spouse name: Rosanne Ashing  . Number of children: 2  . Years of education: Not on file  . Highest education level: Not on file  Occupational History  . Occupation: retired  Engineer, production  . Financial resource strain: Not on file  . Food insecurity:    Worry: Not on file    Inability: Not on file  . Transportation needs:    Medical: Not on file    Non-medical: Not on file  Tobacco Use  . Smoking status: Former Smoker    Packs/day: 0.50    Years: 5.00    Pack years: 2.50    Types: Cigarettes    Last attempt to quit: 08/29/1969    Years since quitting: 48.9  . Smokeless tobacco: Never Used  Substance and Sexual Activity  . Alcohol use: Yes    Alcohol/week: 10.0 standard drinks    Types: 10 Glasses of wine per week  . Drug use: No  . Sexual activity: Never    Partners: Male    Birth control/protection:  Post-menopausal  Lifestyle  . Physical activity:    Days per week: Not on file    Minutes per session: Not on file  . Stress: Not on file  Relationships  . Social connections:    Talks on phone: Not on file    Gets together: Not on file    Attends religious service: Not on file    Active member of club or organization: Not on file    Attends meetings of clubs or organizations: Not on file    Relationship status: Not on file  . Intimate partner violence:    Fear of current or ex partner: Not on file    Emotionally abused: Not on file    Physically abused: Not on file    Forced sexual activity: Not on file  Other Topics Concern  . Not on file  Social History Narrative   Married, retired Wellsite geologistart teacher   2 daughters, 4 grandchildren   Son-in-law Dr. Aggie HackerBrian Sumner of  [facility]    Family History  Problem Relation Age of Onset  . Lung cancer Father         died at age 70, was a smoker  . COPD Father   . Atrial fibrillation Father   . Heart attack Paternal Grandfather   . Heart attack Maternal Grandfather   . Vascular Disease Mother   . Hyperlipidemia Sister   . Schizophrenia Sister   . Hyperlipidemia Brother   . Hyperlipidemia Brother   . Heart attack Cousin   . Colon cancer Cousin   . Colon cancer Maternal Aunt   . Diabetes Neg Hx     ROS: no fevers or chills, productive cough, hemoptysis, dysphasia, odynophagia, melena, hematochezia, dysuria, hematuria, rash, seizure activity, orthopnea, PND, pedal edema, claudication. Remaining systems are negative.  Physical Exam: Well-developed well-nourished in no acute distress.  Skin is warm and dry.  HEENT is normal.  Neck is supple.  Chest is clear to auscultation with normal expansion.  Cardiovascular exam is regular rate and rhythm.  Abdominal exam nontender or distended. No masses palpated. Extremities show no edema. neuro grossly intact  ECG- personally reviewed  A/P  1 supraventricular tachycardia-patient has had no recurrent episodes since last office visit.  Continue beta-blocker.  If she has more frequent episodes in the future we will consider referral to electrophysiology for ablation.  2 hyperlipidemia-continue statin.  Lipids and liver monitored by primary care.  Olga MillersBrian Geralene Afshar, MD

## 2018-08-17 ENCOUNTER — Ambulatory Visit: Payer: Medicare Other | Admitting: Cardiology

## 2018-08-24 ENCOUNTER — Encounter: Payer: Self-pay | Admitting: *Deleted

## 2018-09-04 ENCOUNTER — Ambulatory Visit (INDEPENDENT_AMBULATORY_CARE_PROVIDER_SITE_OTHER): Payer: Medicare Other | Admitting: Certified Nurse Midwife

## 2018-09-04 ENCOUNTER — Other Ambulatory Visit (HOSPITAL_COMMUNITY)
Admission: RE | Admit: 2018-09-04 | Discharge: 2018-09-04 | Disposition: A | Discharge status: Home / Self Care | Payer: Medicare Other | Source: Ambulatory Visit | Attending: Certified Nurse Midwife | Admitting: Certified Nurse Midwife

## 2018-09-04 ENCOUNTER — Encounter: Payer: Self-pay | Admitting: Certified Nurse Midwife

## 2018-09-04 ENCOUNTER — Other Ambulatory Visit: Payer: Self-pay

## 2018-09-04 VITALS — BP 104/64 | HR 68 | Resp 16 | Ht 63.75 in | Wt 134.0 lb

## 2018-09-04 DIAGNOSIS — Z124 Encounter for screening for malignant neoplasm of cervix: Secondary | ICD-10-CM | POA: Insufficient documentation

## 2018-09-04 DIAGNOSIS — N951 Menopausal and female climacteric states: Secondary | ICD-10-CM

## 2018-09-04 DIAGNOSIS — Z780 Asymptomatic menopausal state: Secondary | ICD-10-CM | POA: Diagnosis not present

## 2018-09-04 DIAGNOSIS — Z01419 Encounter for gynecological examination (general) (routine) without abnormal findings: Secondary | ICD-10-CM

## 2018-09-04 DIAGNOSIS — Z8739 Personal history of other diseases of the musculoskeletal system and connective tissue: Secondary | ICD-10-CM

## 2018-09-04 DIAGNOSIS — Z8659 Personal history of other mental and behavioral disorders: Secondary | ICD-10-CM

## 2018-09-04 NOTE — Patient Instructions (Signed)

## 2018-09-04 NOTE — Progress Notes (Signed)
73 y.o. G41P2002 Married  Caucasian Fe here for annual exam. Post menopausal, no vaginal bleeding, using coconut oil for dryness. Sees Dr. Waynard Edwards for aex, cholesterol, anxiety management and labs,. All labs normal. Continues on Boniva with PCP management for Osteoporosis. Staying active with spouse with walking. No health issues today.  Patient's last menstrual period was 04/29/2006 (approximate).          Sexually active: No.  The current method of family planning is post menopausal status.    Exercising: Yes.    walking & yoga Smoker:  no  Review of Systems  Constitutional: Negative.   HENT: Negative.   Eyes: Negative.   Respiratory: Negative.   Cardiovascular: Negative.   Gastrointestinal: Negative.   Genitourinary: Negative.   Musculoskeletal: Negative.   Skin: Negative.   Neurological: Negative.   Endo/Heme/Allergies: Negative.   Psychiatric/Behavioral: Negative.     Health Maintenance: Pap:  07-08-15 neg History of Abnormal Pap: ? just a repeat done MMG:  2019 pt to sign release Self Breast exams: occ Colonoscopy:  2012 f/u 4yrs due in 2019 BMD:   2016 osteoporosis spine, osteopenia in hips TDaP:  Updated 2019 Shingles: 2009 Pneumonia: 2015  Hep C and HIV: hep c neg 2016 Labs: if needed   reports that she quit smoking about 49 years ago. Her smoking use included cigarettes. She has a 2.50 pack-year smoking history. She has never used smokeless tobacco. She reports current alcohol use of about 7.0 standard drinks of alcohol per week. She reports that she does not use drugs.  Past Medical History:  Diagnosis Date  . Anxiety   . Bunion   . Collagenous colitis 03/2006  . GERD (gastroesophageal reflux disease)   . Hyperlipidemia   . Osteoporosis 10/10  . Personal history of colonic polyps 03/2006   5 mm rectal adenoma  . Seasonal allergies   . Supraventricular premature beats     Past Surgical History:  Procedure Laterality Date  . COLONOSCOPY  03/2006, 07/08/2011    2007 - collagenous colitis, 5 mm rectal adenoma 2012 - normal  . FACIAL COSMETIC SURGERY  2012  . WISDOM TOOTH EXTRACTION  age 89    Current Outpatient Medications  Medication Sig Dispense Refill  . aspirin 81 MG tablet Take 81 mg by mouth. 3 days a week    . budesonide (ENTOCORT EC) 3 MG 24 hr capsule Take 3 capsules (9 mg total) by mouth daily. (Patient taking differently: Take 9 mg by mouth as needed. ) 90 capsule 1  . Cholecalciferol (VITAMIN D PO) Take by mouth.    . Cyanocobalamin (VITAMIN B 12) 100 MCG LOZG Take by mouth.    . diphenoxylate-atropine (LOMOTIL) 2.5-0.025 MG tablet Take 1 tablet by mouth 4 (four) times daily as needed for diarrhea or loose stools. 60 tablet 0  . escitalopram (LEXAPRO) 20 MG tablet daily.    Marland Kitchen ibandronate (BONIVA) 150 MG tablet Take in the morning with a full glass of water, on an empty stomach, and do not take anything else by mouth or lie down for the next 30 min. (Patient taking differently: every 30 (thirty) days. Take in the morning with a full glass of water, on an empty stomach, and do not take anything else by mouth or lie down for the next 30 min.) 3 tablet 3  . metoprolol tartrate (LOPRESSOR) 25 MG tablet Take 1 tablet (25 mg total) by mouth 2 (two) times daily. 180 tablet 3  . pantoprazole (PROTONIX) 40 MG tablet  Take 1 tablet by mouth every other day.     . simvastatin (ZOCOR) 40 MG tablet Take 1 tablet by mouth daily.     No current facility-administered medications for this visit.     Family History  Problem Relation Age of Onset  . Lung cancer Father        died at age 71, was a smoker  . COPD Father   . Atrial fibrillation Father   . Heart attack Paternal Grandfather   . Heart attack Maternal Grandfather   . Vascular Disease Mother   . Hyperlipidemia Sister   . Schizophrenia Sister   . Hyperlipidemia Brother   . Hyperlipidemia Brother   . Heart attack Cousin   . Colon cancer Cousin   . Colon cancer Maternal Aunt   . Diabetes  Neg Hx     ROS:  Pertinent items are noted in HPI.  Otherwise, a comprehensive ROS was negative.  Exam:   BP 104/64   Pulse 68   Resp 16   Ht 5' 3.75" (1.619 m)   Wt 134 lb (60.8 kg)   LMP 04/29/2006 (Approximate)   BMI 23.18 kg/m  Height: 5' 3.75" (161.9 cm) Ht Readings from Last 3 Encounters:  09/04/18 5' 3.75" (1.619 m)  08/11/17 5' 3.75" (1.619 m)  08/10/17 5' 3.75" (1.619 m)    General appearance: alert, cooperative and appears stated age Head: Normocephalic, without obvious abnormality, atraumatic Neck: no adenopathy, supple, symmetrical, trachea midline and thyroid normal to inspection and palpation Lungs: clear to auscultation bilaterally Breasts: normal appearance, no masses or tenderness, No nipple retraction or dimpling, No nipple discharge or bleeding, No axillary or supraclavicular adenopathy Heart: regular rate and rhythm Abdomen: soft, non-tender; no masses,  no organomegaly Extremities: extremities normal, atraumatic, no cyanosis or edema Skin: Skin color, texture, turgor normal. No rashes or lesions Lymph nodes: Cervical, supraclavicular, and axillary nodes normal. No abnormal inguinal nodes palpated Neurologic: Grossly normal   Pelvic: External genitalia:  no lesions              Urethra:  normal appearing urethra with no masses, tenderness or lesions              Bartholin's and Skene's: normal                 Vagina: normal appearing vagina with normal color and discharge, no lesions              Cervix: multiparous appearance, no cervical motion tenderness and no lesions              Pap taken: Yes.   Bimanual Exam:  Uterus:  normal size, contour, position, consistency, mobility, non-tender and mid position              Adnexa: normal adnexa and no mass, fullness, tenderness               Rectovaginal: Confirms               Anus:  normal sphincter tone, no lesions  Chaperone present: yes  A:  Well Woman with normal exam  Post  menopausal  Osteoporosis on Boniva with PCP management  Atrophic vaginitis using OTC coconut oil   Hyperlipidemia/anxiety/GERD with PCP management  P:   Reviewed health and wellness pertinent to exam  Aware of need to advise if vaginal bleeding.  Continue follow up with BMD as indicated with MD  Discussed finding and encouraged more frequent use for best  outcome. Will call if problems.  Continue follow up with PCP as indicated.  Pap smear: yes   counseled on breast self exam, mammography screening, feminine hygiene, adequate intake of calcium and vitamin D, diet and exercise, Kegel's exercises  return annually or prn  An After Visit Summary was printed and given to the patient.

## 2018-09-05 LAB — CYTOLOGY - PAP
Diagnosis: NEGATIVE
HPV: NOT DETECTED

## 2018-09-20 ENCOUNTER — Encounter: Payer: Self-pay | Admitting: Internal Medicine

## 2018-10-11 ENCOUNTER — Encounter: Payer: Self-pay | Admitting: Cardiology

## 2018-10-12 ENCOUNTER — Ambulatory Visit (AMBULATORY_SURGERY_CENTER): Payer: Medicare Other

## 2018-10-12 VITALS — Ht 64.0 in | Wt 135.0 lb

## 2018-10-12 DIAGNOSIS — Z86010 Personal history of colonic polyps: Secondary | ICD-10-CM

## 2018-10-12 NOTE — Progress Notes (Signed)
Denies allergies to eggs or soy products. Denies complication of anesthesia or sedation. Denies use of weight loss medication. Denies use of O2.   Emmi instructions declined.  

## 2018-10-24 NOTE — Progress Notes (Signed)
HPI: FU SVT. Echocardiogram in August of 2007 showed normal LV function. Stress Myoview in 2011 apparently was normal. Exercise treadmill in September of 2014; patient exercised for 9 minutes with no symptoms and normal ST segments. Since she was last seen  there is no dyspnea on exertion, orthopnea, PND, chest pain or syncope.  She had one episode of palpitations 2 months ago that was similar to her previous episodes of SVT.  Current Outpatient Medications  Medication Sig Dispense Refill  . aspirin 81 MG tablet Take 81 mg by mouth every other day. 3 days a week    . Cholecalciferol (VITAMIN D PO) Take by mouth.    . Cyanocobalamin (VITAMIN B 12) 100 MCG LOZG Take by mouth.    . escitalopram (LEXAPRO) 20 MG tablet daily.    Marland Kitchen ibandronate (BONIVA) 150 MG tablet Take in the morning with a full glass of water, on an empty stomach, and do not take anything else by mouth or lie down for the next 30 min. (Patient taking differently: every 30 (thirty) days. Take in the morning with a full glass of water, on an empty stomach, and do not take anything else by mouth or lie down for the next 30 min.) 3 tablet 3  . metoprolol tartrate (LOPRESSOR) 25 MG tablet Take 1 tablet (25 mg total) by mouth 2 (two) times daily. 180 tablet 3  . pantoprazole (PROTONIX) 40 MG tablet Take 1 tablet by mouth every other day.     . simvastatin (ZOCOR) 40 MG tablet Take 1 tablet by mouth daily.     Current Facility-Administered Medications  Medication Dose Route Frequency Provider Last Rate Last Dose  . 0.9 %  sodium chloride infusion  500 mL Intravenous Once Iva Boop, MD         Past Medical History:  Diagnosis Date  . Allergy   . Anxiety   . Bunion   . Collagenous colitis 03/2006  . GERD (gastroesophageal reflux disease)   . Hyperlipidemia   . Osteoporosis 10/10  . Personal history of colonic polyps 03/2006   5 mm rectal adenoma  . Seasonal allergies   . Supraventricular premature beats     Past  Surgical History:  Procedure Laterality Date  . COLONOSCOPY  03/2006, 07/08/2011   2007 - collagenous colitis, 5 mm rectal adenoma 2012 - normal  . FACIAL COSMETIC SURGERY  2012  . WISDOM TOOTH EXTRACTION  age 35    Social History   Socioeconomic History  . Marital status: Married    Spouse name: Rosanne Ashing  . Number of children: 2  . Years of education: Not on file  . Highest education level: Not on file  Occupational History  . Occupation: retired  Engineer, production  . Financial resource strain: Not on file  . Food insecurity:    Worry: Not on file    Inability: Not on file  . Transportation needs:    Medical: Not on file    Non-medical: Not on file  Tobacco Use  . Smoking status: Former Smoker    Packs/day: 0.50    Years: 5.00    Pack years: 2.50    Types: Cigarettes    Last attempt to quit: 08/29/1969    Years since quitting: 49.2  . Smokeless tobacco: Never Used  Substance and Sexual Activity  . Alcohol use: Yes    Alcohol/week: 7.0 standard drinks    Types: 7 Glasses of wine per week  . Drug  use: No  . Sexual activity: Not Currently    Partners: Male    Birth control/protection: Post-menopausal  Lifestyle  . Physical activity:    Days per week: Not on file    Minutes per session: Not on file  . Stress: Not on file  Relationships  . Social connections:    Talks on phone: Not on file    Gets together: Not on file    Attends religious service: Not on file    Active member of club or organization: Not on file    Attends meetings of clubs or organizations: Not on file    Relationship status: Not on file  . Intimate partner violence:    Fear of current or ex partner: Not on file    Emotionally abused: Not on file    Physically abused: Not on file    Forced sexual activity: Not on file  Other Topics Concern  . Not on file  Social History Narrative   Married, retired Wellsite geologist   2 daughters, 4 grandchildren   Son-in-law Dr. Aggie Hacker of  [facility]     Family History  Problem Relation Age of Onset  . Lung cancer Father        died at age 78, was a smoker  . COPD Father   . Atrial fibrillation Father   . Heart attack Paternal Grandfather   . Heart attack Maternal Grandfather   . Vascular Disease Mother   . Hyperlipidemia Sister   . Schizophrenia Sister   . Hyperlipidemia Brother   . Hyperlipidemia Brother   . Heart attack Cousin   . Colon cancer Cousin   . Colon cancer Maternal Aunt   . Diabetes Neg Hx   . Esophageal cancer Neg Hx   . Rectal cancer Neg Hx   . Stomach cancer Neg Hx     ROS: no fevers or chills, productive cough, hemoptysis, dysphasia, odynophagia, melena, hematochezia, dysuria, hematuria, rash, seizure activity, orthopnea, PND, pedal edema, claudication. Remaining systems are negative.  Physical Exam: Well-developed well-nourished in no acute distress.  Skin is warm and dry.  HEENT is normal.  Neck is supple.  Chest is clear to auscultation with normal expansion.  Cardiovascular exam is regular rate and rhythm.  Abdominal exam nontender or distended. No masses palpated. Extremities show no edema. 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  Cannot rule out septal infarct.  Personally reviewed  A/P  1 supraventricular tachycardia-only one episode since last office visit.  Continue beta-blocker.  We can consider referral for ablation in the future if she has more frequent episodes despite medical therapy.  2 hyperlipidemia-continue statin.  Lipids and liver monitored by primary care.  Olga Millers, MD

## 2018-10-26 ENCOUNTER — Encounter: Payer: Self-pay | Admitting: Internal Medicine

## 2018-10-26 ENCOUNTER — Ambulatory Visit (AMBULATORY_SURGERY_CENTER): Payer: Medicare Other | Admitting: Internal Medicine

## 2018-10-26 VITALS — BP 112/62 | HR 61 | Temp 98.0°F | Resp 12 | Ht 63.75 in | Wt 134.0 lb

## 2018-10-26 DIAGNOSIS — Z860100 Personal history of colon polyps, unspecified: Secondary | ICD-10-CM

## 2018-10-26 MED ORDER — SODIUM CHLORIDE 0.9 % IV SOLN: 500.0000 mL | Freq: Once | INTRAVENOUS | Status: DC

## 2018-10-26 NOTE — Progress Notes (Signed)
To PACU, VSS. Report to [email or web address] 

## 2018-10-26 NOTE — Op Note (Signed)
[facility] Patient Name: [name] Procedure Date: 10/26/2018 1:56 PM MRN: [id number] Endoscopist: Iva Boop , MD Age: 73 Referring MD:  Date of Birth: [birth year] Gender: Female Account #: [id number] Procedure:                Colonoscopy Indications:              High risk colon cancer surveillance: Personal                            history of colonic polyps Medicines:                Propofol per Anesthesia, Monitored Anesthesia Care Procedure:                Pre-Anesthesia Assessment:                           - Prior to the procedure, a History and Physical                            was performed, and patient medications and                            allergies were reviewed. The patient's tolerance of                            previous anesthesia was also reviewed. The risks                            and benefits of the procedure and the sedation                            options and risks were discussed with the patient.                            All questions were answered, and informed consent                            was obtained. Prior Anticoagulants: The patient has                            taken no previous anticoagulant or antiplatelet                            agents. ASA Grade Assessment: II - A patient with                            mild systemic disease. After reviewing the risks                            and benefits, the patient was deemed in                            satisfactory condition to undergo the procedure.  After obtaining informed consent, the colonoscope                            was passed under direct vision. Throughout the                            procedure, the patient's blood pressure, pulse, and                            oxygen saturations were monitored continuously. The                            Colonoscope was introduced through the anus and                            advanced to the  the cecum, identified by                            appendiceal orifice and ileocecal valve. The                            quality of the bowel preparation was excellent. The                            colonoscopy was performed without difficulty. The                            patient tolerated the procedure well. The bowel                            preparation used was Miralax. Scope In: 2:06:52 PM Scope Out: 2:21:25 PM Scope Withdrawal Time: 0 hours 10 minutes 51 seconds  Total Procedure Duration: 0 hours 14 minutes 33 seconds  Findings:                 The perianal and digital rectal examinations were                            normal.                           The colon (entire examined portion) appeared normal.                           No additional abnormalities were found on                            retroflexion. Complications:            No immediate complications. Estimated blood loss:                            None. Estimated Blood Loss:     Estimated blood loss: none. Impression:               - The entire examined colon is normal.                           -  No specimens collected.                           - Personal history of colonic polyp - 5 mm adenoma                            2007, none since. Recommendation:           - Patient has a contact number available for                            emergencies. The signs and symptoms of potential                            delayed complications were discussed with the                            patient. Return to normal activities tomorrow.                            Written discharge instructions were provided to the                            patient.                           - Resume previous diet.                           - Continue present medications.                           - No repeat colonoscopy due to age and the absence                            of colonic polyps. Does not need annual hemoccult                             testing either. Iva Boop, MD 10/26/2018 2:27:10 PM This report has been signed electronically.

## 2018-10-26 NOTE — Patient Instructions (Addendum)
No polyps today so no more routine colonoscopy or stool tests needed. Will save it for problems, if they occur - hope not!  I appreciate the opportunity to care for you. Iva Boop, MD, [facility]  Resume previous diet. Continue present medications.  No repeat colonoscopy due to age and absence of colonic polyps.    YOU HAD AN ENDOSCOPIC PROCEDURE TODAY AT THE [facility]:   Refer to the procedure report that was given to you for any specific questions about what was found during the examination.  If the procedure report does not answer your questions, please call your gastroenterologist to clarify.  If you requested that your care partner not be given the details of your procedure findings, then the procedure report has been included in a sealed envelope for you to review at your convenience later.  YOU SHOULD EXPECT: Some feelings of bloating in the abdomen. Passage of more gas than usual.  Walking can help get rid of the air that was put into your GI tract during the procedure and reduce the bloating. If you had a lower endoscopy (such as a colonoscopy or flexible sigmoidoscopy) you may notice spotting of blood in your stool or on the toilet paper. If you underwent a bowel prep for your procedure, you may not have a normal bowel movement for a few days.  Please Note:  You might notice some irritation and congestion in your nose or some drainage.  This is from the oxygen used during your procedure.  There is no need for concern and it should clear up in a day or so.  SYMPTOMS TO REPORT IMMEDIATELY:   Following lower endoscopy (colonoscopy or flexible sigmoidoscopy):  Excessive amounts of blood in the stool  Significant tenderness or worsening of abdominal pains  Swelling of the abdomen that is new, acute  Fever of 100F or higher For urgent or emergent issues, a gastroenterologist can be reached at any hour by calling [phone].   DIET:  We do recommend a small  meal at first, but then you may proceed to your regular diet.  Drink plenty of fluids but you should avoid alcoholic beverages for 24 hours.  ACTIVITY:  You should plan to take it easy for the rest of today and you should NOT DRIVE or use heavy machinery until tomorrow (because of the sedation medicines used during the test).    FOLLOW UP: Our staff will call the number listed on your records the next business day following your procedure to check on you and address any questions or concerns that you may have regarding the information given to you following your procedure. If we do not reach you, we will leave a message.  However, if you are feeling well and you are not experiencing any problems, there is no need to return our call.  We will assume that you have returned to your regular daily activities without incident.  If any biopsies were taken you will be contacted by phone or by letter within the next 1-3 weeks.  Please call us at [phone] if you have not heard about the biopsies in 3 weeks.    SIGNATURES/CONFIDENTIALITY: You and/or your care partner have signed paperwork which will be entered into your electronic medical record.  These signatures attest to the fact that that the information above on your After Visit Summary has been reviewed and is understood.  Full responsibility of the confidentiality of this discharge information lies with you and/or  your care-partner. 

## 2018-10-26 NOTE — Progress Notes (Signed)
Pt's states no medical or surgical changes since previsit or office visit. 

## 2018-10-29 ENCOUNTER — Encounter: Payer: Self-pay | Admitting: Cardiology

## 2018-10-29 ENCOUNTER — Ambulatory Visit (INDEPENDENT_AMBULATORY_CARE_PROVIDER_SITE_OTHER): Payer: Medicare Other | Admitting: Cardiology

## 2018-10-29 ENCOUNTER — Telehealth: Payer: Self-pay

## 2018-10-29 VITALS — BP 112/68 | HR 61 | Ht 64.0 in | Wt 138.0 lb

## 2018-10-29 DIAGNOSIS — I471 Supraventricular tachycardia: Secondary | ICD-10-CM | POA: Diagnosis not present

## 2018-10-29 DIAGNOSIS — E7800 Pure hypercholesterolemia, unspecified: Secondary | ICD-10-CM

## 2018-10-29 NOTE — Telephone Encounter (Signed)
  Follow up Call-  Call back number 10/26/2018  Post procedure Call Back phone  # [phone]  Permission to leave phone message Yes  Some recent data might be hidden     Patient questions:  Do you have a fever, pain , or abdominal swelling? No. Pain Score  0 *  Have you tolerated food without any problems? Yes.    Have you been able to return to your normal activities? Yes.    Do you have any questions about your discharge instructions: Diet   No. Medications  No. Follow up visit  No.  Do you have questions or concerns about your Care? No.  Actions: * If pain score is 4 or above: No action needed, pain <4.

## 2018-10-29 NOTE — Patient Instructions (Signed)

## 2018-11-05 ENCOUNTER — Other Ambulatory Visit: Payer: Self-pay | Admitting: Cardiology

## 2019-06-28 ENCOUNTER — Other Ambulatory Visit: Payer: Self-pay

## 2019-06-28 DIAGNOSIS — Z20822 Contact with and (suspected) exposure to covid-19: Secondary | ICD-10-CM

## 2019-06-29 LAB — NOVEL CORONAVIRUS, NAA: SARS-CoV-2, NAA: NOT DETECTED

## 2019-09-06 ENCOUNTER — Other Ambulatory Visit: Payer: Self-pay

## 2019-09-09 NOTE — Progress Notes (Signed)
74 y.o. G31P2002 Married  Caucasian Fe here for annual exam. Post menopausal no HRT. Denies vaginal bleeding. No vaginal dryness changes. No UTI's in the past year. Sees Dr. Waynard Edwards for hypertension, cholesterol, osteoporosis on Boniva, aex and labs. All stable per patient. No health issues today.  Patient's last menstrual period was 04/29/2006 (approximate).          Sexually active: No.  The current method of family planning is post menopausal status.    Exercising: Yes.    walking Smoker:  no  Review of Systems  Constitutional: Negative.   HENT: Negative.   Eyes: Negative.   Respiratory: Negative.   Cardiovascular: Negative.   Gastrointestinal: Negative.   Genitourinary: Negative.   Musculoskeletal: Negative.   Skin: Negative.   Neurological: Negative.   Endo/Heme/Allergies: Negative.   Psychiatric/Behavioral: Negative.     Health Maintenance: Pap:  07-08-15 neg, 09-04-2018 neg HPV HR neg History of Abnormal Pap: just a repeat done MMG:  2020 neg per patient went to PCP Self Breast exams: no Colonoscopy:  2020 normal per patient BMD:   2016 osteoporosis spine, osteopenia in hips TDaP:  2019 Shingles: 2009 Pneumonia: 2015 Hep C and HIV: hep c neg 2016 Labs: PCP   reports that she quit smoking about 50 years ago. Her smoking use included cigarettes. She has a 2.50 pack-year smoking history. She has never used smokeless tobacco. She reports current alcohol use of about 7.0 standard drinks of alcohol per week. She reports that she does not use drugs.  Past Medical History:  Diagnosis Date  . Allergy   . Anxiety   . Bunion   . Collagenous colitis 03/2006  . GERD (gastroesophageal reflux disease)   . Hyperlipidemia   . Osteoporosis 10/10  . Personal history of colonic polyps 03/2006   5 mm rectal adenoma  . Seasonal allergies   . Supraventricular premature beats     Past Surgical History:  Procedure Laterality Date  . COLONOSCOPY  03/2006, 07/08/2011   2007 - collagenous  colitis, 5 mm rectal adenoma 2012 - normal  . FACIAL COSMETIC SURGERY  2012  . WISDOM TOOTH EXTRACTION  age 25    Current Outpatient Medications  Medication Sig Dispense Refill  . aspirin 81 MG tablet Take 81 mg by mouth every other day. 3 days a week    . Cholecalciferol (VITAMIN D PO) Take by mouth.    . Cyanocobalamin (VITAMIN B 12) 100 MCG LOZG Take by mouth.    . escitalopram (LEXAPRO) 20 MG tablet daily.    Marland Kitchen ibandronate (BONIVA) 150 MG tablet Take in the morning with a full glass of water, on an empty stomach, and do not take anything else by mouth or lie down for the next 30 min. (Patient taking differently: every 30 (thirty) days. Take in the morning with a full glass of water, on an empty stomach, and do not take anything else by mouth or lie down for the next 30 min.) 3 tablet 3  . metoprolol tartrate (LOPRESSOR) 25 MG tablet TAKE 1 TABLET BY MOUTH TWICE A DAY 180 tablet 3  . pantoprazole (PROTONIX) 40 MG tablet Take 1 tablet by mouth every other day.     . simvastatin (ZOCOR) 40 MG tablet Take 1 tablet by mouth daily.     No current facility-administered medications for this visit.    Family History  Problem Relation Age of Onset  . Lung cancer Father        died at age  72, was a smoker  . COPD Father   . Atrial fibrillation Father   . Heart attack Paternal Grandfather   . Heart attack Maternal Grandfather   . Vascular Disease Mother   . Hyperlipidemia Sister   . Schizophrenia Sister   . Hyperlipidemia Brother   . Hyperlipidemia Brother   . Heart attack Cousin   . Colon cancer Cousin   . Colon cancer Maternal Aunt   . Diabetes Neg Hx   . Esophageal cancer Neg Hx   . Rectal cancer Neg Hx   . Stomach cancer Neg Hx     ROS:  Pertinent items are noted in HPI.  Otherwise, a comprehensive ROS was negative.  Exam:   BP 110/70   Pulse 68   Temp (!) 97.2 F (36.2 C) (Skin)   Resp 16   Ht 5' 3.75" (1.619 m)   Wt 134 lb (60.8 kg)   LMP 04/29/2006 (Approximate)    BMI 23.18 kg/m  Height: 5' 3.75" (161.9 cm) Ht Readings from Last 3 Encounters:  09/10/19 5' 3.75" (1.619 m)  10/29/18 5\' 4"  (1.626 m)  10/26/18 5' 3.75" (1.619 m)    General appearance: alert, cooperative and appears stated age Head: Normocephalic, without obvious abnormality, atraumatic Neck: no adenopathy, supple, symmetrical, trachea midline and thyroid normal to inspection and palpation Lungs: clear to auscultation bilaterally Breasts: normal appearance, no masses or tenderness, No nipple retraction or dimpling, No nipple discharge or bleeding, No axillary or supraclavicular adenopathy Heart: regular rate and rhythm Abdomen: soft, non-tender; no masses,  no organomegaly Extremities: extremities normal, atraumatic, no cyanosis or edema Skin: Skin color, texture, turgor normal. No rashes or lesions Lymph nodes: Cervical, supraclavicular, and axillary nodes normal. No abnormal inguinal nodes palpated Neurologic: Grossly normal   Pelvic: External genitalia:  no lesions              Urethra:  normal appearing urethra with no masses, tenderness or lesions              Bartholin's and Skene's: normal                 Vagina: normal appearing vagina with normal color and discharge, no lesions              Cervix: no cervical motion tenderness, no lesions and normal appearance              Pap taken: No. Bimanual Exam:  Uterus:  normal size, contour, position, consistency, mobility, non-tender and mid position              Adnexa: normal adnexa and no mass, fullness, tenderness               Rectovaginal: Confirms               Anus:  normal sphincter tone, no lesions  Chaperone present: yes  A:  Well Woman with normal exam  Post menopausal  Osteoporosis on Boniva with PCP management  Hypertension, cholesterol on stable medication  Mammogram normal per patient will request  P:   Reviewed health and wellness pertinent to exam  Aware of need to advise if vaginal bleeding  Continue  follow up regarding Osteoporosis and work of yoga if possible  Continue follow up with PCP as indicated  Pap smear: no   counseled on breast self exam, mammography screening, feminine hygiene, adequate intake of calcium and vitamin D, diet and exercise, Kegel's exercises  return annually or prn  An  After Visit Summary was printed and given to the patient.

## 2019-09-10 ENCOUNTER — Encounter: Payer: Self-pay | Admitting: Certified Nurse Midwife

## 2019-09-10 ENCOUNTER — Other Ambulatory Visit: Payer: Self-pay

## 2019-09-10 ENCOUNTER — Ambulatory Visit (INDEPENDENT_AMBULATORY_CARE_PROVIDER_SITE_OTHER): Payer: Medicare HMO | Admitting: Certified Nurse Midwife

## 2019-09-10 VITALS — BP 110/70 | HR 68 | Temp 97.2°F | Resp 16 | Ht 63.75 in | Wt 134.0 lb

## 2019-09-10 DIAGNOSIS — Z01419 Encounter for gynecological examination (general) (routine) without abnormal findings: Secondary | ICD-10-CM | POA: Diagnosis not present

## 2019-09-10 NOTE — Patient Instructions (Signed)
EXERCISE AND DIET:  We recommended that you start or continue a regular exercise program for good health. Regular exercise means any activity that makes your heart beat faster and makes you sweat.  We recommend exercising at least 30 minutes per day at least 3 days a week, preferably 4 or 5.  We also recommend a diet low in fat and sugar.  Inactivity, poor dietary choices and obesity can cause diabetes, heart attack, stroke, and kidney damage, among others.    ALCOHOL AND SMOKING:  Women should limit their alcohol intake to no more than 7 drinks/beers/glasses of wine (combined, not each!) per week. Moderation of alcohol intake to this level decreases your risk of breast cancer and liver damage. And of course, no recreational drugs are part of a healthy lifestyle.  And absolutely no smoking or even second hand smoke. Most people know smoking can cause heart and lung diseases, but did you know it also contributes to weakening of your bones? Aging of your skin?  Yellowing of your teeth and nails?  CALCIUM AND VITAMIN D:  Adequate intake of calcium and Vitamin D are recommended.  The recommendations for exact amounts of these supplements seem to change often, but generally speaking 600 mg of calcium (either carbonate or citrate) and 800 units of Vitamin D per day seems prudent. Certain women may benefit from higher intake of Vitamin D.  If you are among these women, your doctor will have told you during your visit.    PAP SMEARS:  Pap smears, to check for cervical cancer or precancers,  have traditionally been done yearly, although recent scientific advances have shown that most women can have pap smears less often.  However, every woman still should have a physical exam from her gynecologist every year. It will include a breast check, inspection of the vulva and vagina to check for abnormal growths or skin changes, a visual exam of the cervix, and then an exam to evaluate the size and shape of the uterus and  ovaries.  And after 72 years of age, a rectal exam is indicated to check for rectal cancers. We will also provide age appropriate advice regarding health maintenance, like when you should have certain vaccines, screening for sexually transmitted diseases, bone density testing, colonoscopy, mammograms, etc.   MAMMOGRAMS:  All women over 52 years old should have a yearly mammogram. Many facilities now offer a "3D" mammogram, which may cost around $50 extra out of pocket. If possible,  we recommend you accept the option to have the 3D mammogram performed.  It both reduces the number of women who will be called back for extra views which then turn out to be normal, and it is better than the routine mammogram at detecting truly abnormal areas.    COLONOSCOPY:  Colonoscopy to screen for colon cancer is recommended for all women at age 18.  We know, you hate the idea of the prep.  We agree, BUT, having colon cancer and not knowing it is worse!!  Colon cancer so often starts as a polyp that can be seen and removed at colonscopy, which can quite literally save your life!  And if your first colonoscopy is normal and you have no family history of colon cancer, most women don't have to have it again for 10 years.  Once every ten years, you can do something that may end up saving your life, right?  We will be happy to help you get it scheduled when you are ready.  Be sure to check your insurance coverage so you understand how much it will cost.  It may be covered as a preventative service at no cost, but you should check your particular policy.      Osteoporosis  Osteoporosis happens when your bones get thin and weak. This can cause your bones to break (fracture) more easily. You can do things at home to make your bones stronger. Follow these instructions at home:  Activity  Exercise as told by your doctor. Ask your doctor what activities are safe for you. You should do: ? Exercises that make your muscles work to  hold your body weight up (weight-bearing exercises). These include tai chi, yoga, and walking. ? Exercises to make your muscles stronger. One example is lifting weights. Lifestyle  Limit alcohol intake to no more than 1 drink a day for nonpregnant women and 2 drinks a day for men. One drink equals 12 oz of beer, 5 oz of wine, or 1 oz of hard liquor.  Do not use any products that have nicotine or tobacco in them. These include cigarettes and e-cigarettes. If you need help quitting, ask your doctor. Preventing falls  Use tools to help you move around (mobility aids) as needed. These include canes, walkers, scooters, and crutches.  Keep rooms well-lit and free of clutter.  Put away things that could make you trip. These include cords and rugs.  Install safety rails on stairs. Install grab bars in bathrooms.  Use rubber mats in slippery areas, like bathrooms.  Wear shoes that: ? Fit you well. ? Support your feet. ? Have closed toes. ? Have rubber soles or low heels.  Tell your doctor about all of the medicines you are taking. Some medicines can make you more likely to fall. General instructions  Eat plenty of calcium and vitamin D. These nutrients are good for your bones. Good sources of calcium and vitamin D include: ? Some fatty fish, such as salmon and tuna. ? Foods that have calcium and vitamin D added to them (fortified foods). For example, some breakfast cereals are fortified with calcium and vitamin D. ? Egg yolks. ? Cheese. ? Liver.  Take over-the-counter and prescription medicines only as told by your doctor.  Keep all follow-up visits as told by your doctor. This is important. Contact a doctor if:  You have not been tested (screened) for osteoporosis and you are: ? A woman who is age 65 or older. ? A man who is age 70 or older. Get help right away if:  You fall.  You get hurt. Summary  Osteoporosis happens when your bones get thin and weak.  Weak bones can  break (fracture) more easily.  Eat plenty of calcium and vitamin D. These nutrients are good for your bones.  Tell your doctor about all of the medicines that you take. This information is not intended to replace advice given to you by your health care provider. Make sure you discuss any questions you have with your health care provider. Document Revised: 07/28/2017 Document Reviewed: 06/09/2017 Elsevier Patient Education  2020 Elsevier Inc.  

## 2019-09-28 ENCOUNTER — Ambulatory Visit: Payer: Medicare Other

## 2019-10-03 ENCOUNTER — Other Ambulatory Visit: Payer: Self-pay | Admitting: Cardiology

## 2019-10-09 ENCOUNTER — Ambulatory Visit: Payer: Medicare Other

## 2019-10-25 NOTE — Progress Notes (Signed)
HPI:FU SVT. Echocardiogram in August of 2007 showed normal LV function. Stress Myoview in 2011 apparently was normal. Exercise treadmill in September of 2014; patient exercised for 9 minutes with no symptoms and normal ST segments. Since she was last seen  she has had occasional chest pain.  It is substernal and described as a sharp sensation without radiation.  It will throb for several minutes and resolved.  It is not exertional, pleuritic or related to food.  She denies exertional chest pain.  Occasional dyspnea that she attributes to anxiety.  No pedal edema or syncope.  Current Outpatient Medications  Medication Sig Dispense Refill  . aspirin 81 MG tablet Take 81 mg by mouth every other day. 3 days a week    . Bacillus Coagulans-Inulin (ALIGN PREBIOTIC-PROBIOTIC PO) Take by mouth.    . Cholecalciferol (VITAMIN D PO) Take by mouth.    . Cyanocobalamin (VITAMIN B 12) 100 MCG LOZG Take by mouth.    . escitalopram (LEXAPRO) 20 MG tablet daily.    Marland Kitchen ibandronate (BONIVA) 150 MG tablet Take in the morning with a full glass of water, on an empty stomach, and do not take anything else by mouth or lie down for the next 30 min. (Patient taking differently: every 30 (thirty) days. Take in the morning with a full glass of water, on an empty stomach, and do not take anything else by mouth or lie down for the next 30 min.) 3 tablet 3  . metoprolol tartrate (LOPRESSOR) 25 MG tablet TAKE 1 TABLET BY MOUTH TWICE A DAY 180 tablet 3  . pantoprazole (PROTONIX) 40 MG tablet Take 1 tablet by mouth every other day.     . simvastatin (ZOCOR) 40 MG tablet Take 1 tablet by mouth daily.     No current facility-administered medications for this visit.     Past Medical History:  Diagnosis Date  . Allergy   . Anxiety   . Bunion   . Collagenous colitis 03/2006  . GERD (gastroesophageal reflux disease)   . Hyperlipidemia   . Osteoporosis 10/10  . Personal history of colonic polyps 03/2006   5 mm rectal  adenoma  . Seasonal allergies   . Shingles   . Supraventricular premature beats     Past Surgical History:  Procedure Laterality Date  . COLONOSCOPY  03/2006, 07/08/2011   2007 - collagenous colitis, 5 mm rectal adenoma 2012 - normal  . FACIAL COSMETIC SURGERY  2012  . WISDOM TOOTH EXTRACTION  age 60    Social History   Socioeconomic History  . Marital status: Married    Spouse name: Rosanne Ashing  . Number of children: 2  . Years of education: Not on file  . Highest education level: Not on file  Occupational History  . Occupation: retired  Tobacco Use  . Smoking status: Former Smoker    Packs/day: 0.50    Years: 5.00    Pack years: 2.50    Types: Cigarettes    Quit date: 08/29/1969    Years since quitting: 50.2  . Smokeless tobacco: Never Used  Substance and Sexual Activity  . Alcohol use: Yes    Alcohol/week: 7.0 standard drinks    Types: 7 Glasses of wine per week  . Drug use: No  . Sexual activity: Not Currently    Partners: Male    Birth control/protection: Post-menopausal  Other Topics Concern  . 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 Monna Fam of  [place] Determinants of Health   Financial Resource Strain:   . Difficulty of Paying Living Expenses: Not on file  Food Insecurity:   . Worried About Charity fundraiser in the Last Year: Not on file  . Ran Out of Food in the Last Year: Not on file  Transportation Needs:   . Lack of Transportation (Medical): Not on file  . Lack of Transportation (Non-Medical): Not on file  Physical Activity:   . Days of Exercise per Week: Not on file  . Minutes of Exercise per Session: Not on file  Stress:   . Feeling of Stress : Not on file  Social Connections:   . Frequency of Communication with Friends and Family: Not on file  . Frequency of Social Gatherings with Friends and Family: Not on file  . Attends Religious Services:  Not on file  . Active Member of Clubs or Organizations: Not on file  . Attends Archivist Meetings: Not on file  . Marital Status: Not on file  Intimate Partner Violence:   . Fear of Current or Ex-Partner: Not on file  . Emotionally Abused: Not on file  . Physically Abused: Not on file  . Sexually Abused: Not on file    Family History  Problem Relation Age of Onset  . Lung cancer Father        died at age 36, was a smoker  . COPD Father   . Atrial fibrillation Father   . Heart attack Paternal Grandfather   . Heart attack Maternal Grandfather   . Vascular Disease Mother   . Hyperlipidemia Sister   . Schizophrenia Sister   . Hyperlipidemia Brother   . Hyperlipidemia Brother   . Heart attack Cousin   . Colon cancer Cousin   . Colon cancer Maternal Aunt   . Diabetes Neg Hx   . Esophageal cancer Neg Hx   . Rectal cancer Neg Hx   . Stomach cancer Neg Hx     ROS: no fevers or chills, productive cough, hemoptysis, dysphasia, odynophagia, melena, hematochezia, dysuria, hematuria, rash, seizure activity, orthopnea, PND, pedal edema, claudication. Remaining systems are negative.  Physical Exam: Well-developed well-nourished in no acute distress.  Skin is warm and dry.  HEENT is normal.  Neck is supple.  Chest is clear to auscultation with normal expansion.  Cardiovascular exam is regular rate and rhythm.  Abdominal exam nontender or distended. No masses palpated. Extremities show no edema. neuro grossly intact  ECG-sinus rhythm at a rate of 66, RV conduction delay, cannot rule out septal infarct.  Personally reviewed  A/P  1 SVT-patient doing well from a symptomatic standpoint with no recurrent episodes.  Continue beta-blocker at present dose.  We will consider referral to electrophysiology in the future if she has more frequent or prolonged episodes.  2 hyperlipidemia-continue statin.  Lipids and liver monitored by primary care.  3 chest pain-symptoms are  atypical.  Electrocardiogram shows no ST changes.  We will arrange a cardiac CTA to further assess.  Kirk Ruths, MD

## 2019-11-04 ENCOUNTER — Ambulatory Visit: Payer: Medicare Other | Admitting: Cardiology

## 2019-11-04 ENCOUNTER — Other Ambulatory Visit: Payer: Self-pay

## 2019-11-04 ENCOUNTER — Encounter: Payer: Self-pay | Admitting: Cardiology

## 2019-11-04 VITALS — BP 128/72 | HR 68 | Temp 96.6°F | Ht 64.0 in | Wt 135.0 lb

## 2019-11-04 DIAGNOSIS — R072 Precordial pain: Secondary | ICD-10-CM

## 2019-11-04 DIAGNOSIS — E7800 Pure hypercholesterolemia, unspecified: Secondary | ICD-10-CM | POA: Diagnosis not present

## 2019-11-04 DIAGNOSIS — I471 Supraventricular tachycardia: Secondary | ICD-10-CM

## 2019-11-04 NOTE — Patient Instructions (Signed)
Medication Instructions:  NO CHANGE *If you need a refill on your cardiac medications before your next appointment, please call your pharmacy*   Lab Work: If you have labs (blood work) drawn today and your tests are completely normal, you will receive your results only by: Marland Kitchen MyChart Message (if you have MyChart) OR . A paper copy in the mail If you have any lab test that is abnormal or we need to change your treatment, we will call you to review the results.   Testing/Procedures: Your cardiac CT will be scheduled at one of the below locations:   [facility] [address] [place], [place] [address] [phone]  OR  [facility] [address] [place], [place] [address] [phone]  If scheduled at [facility], please arrive at the [facility] main entrance of [facility] 30 minutes prior to test start time. Proceed to the [facility] Radiology Department (first floor) to check-in and test prep.  If scheduled at [facility], please arrive 15 mins early for check-in and test prep.  Please follow these instructions carefully (unless otherwise directed):  Hold all erectile dysfunction medications at least 3 days (72 hrs) prior to test.  On the Night Before the Test: . Be sure to Drink plenty of water. . Do not consume any caffeinated/decaffeinated beverages or chocolate 12 hours prior to your test. . Do not take any antihistamines 12 hours prior to your test. . If you take Metformin do not take 24 hours prior to test.  On the Day of the Test: . Drink plenty of water. Do not drink any water within one hour of the test. . Do not eat any food 4 hours prior to the test. . You may take your regular medications prior to the test.  . Take metoprolol (Lopressor) 100 MG two hours prior to test. . HOLD Furosemide/Hydrochlorothiazide morning of the test. . FEMALES- please wear  underwire-free bra if available       After the Test: . Drink plenty of water. . After receiving IV contrast, you may experience a mild flushed feeling. This is normal. . On occasion, you may experience a mild rash up to 24 hours after the test. This is not dangerous. If this occurs, you can take Benadryl 25 mg and increase your fluid intake. . If you experience trouble breathing, this can be serious. If it is severe call 911 IMMEDIATELY. If it is mild, please call our office. . If you take any of these medications: Glipizide/Metformin, Avandament, Glucavance, please do not take 48 hours after completing test unless otherwise instructed.   Once we have confirmed authorization from your insurance company, we will call you to set up a date and time for your test.   For non-scheduling related questions, please contact the cardiac imaging nurse navigator should you have any questions/concerns: Rockwell Alexandria, RN Navigator Cardiac Imaging [facility] [phone] office     Follow-Up: At [facility], you and your health needs are our priority.  As part of our continuing mission to provide you with exceptional heart care, we have created designated Provider Care Teams.  These Care Teams include your primary Cardiologist (physician) and Advanced Practice Providers (APPs -  Physician Assistants and Nurse Practitioners) who all work together to provide you with the care you need, when you need it.  We recommend signing up for the patient portal called "MyChart".  Sign up  information is provided on this After Visit Summary.  MyChart is used to connect with patients for Virtual Visits (Telemedicine).  Patients are able to view lab/test results, encounter notes, upcoming appointments, etc.  Non-urgent messages can be sent to your provider as well.   To learn more about what you can do with MyChart, go to [email or web address].    Your next appointment:   12  month(s)  The format for your next appointment:   Either In Person or Virtual  Provider:   You may see Kirk Ruths MD or one of the following Advanced Practice Providers on your designated Care Team:    Kerin Ransom, PA-C  [place], [place]  Coletta Memos, [place]

## 2019-11-12 ENCOUNTER — Other Ambulatory Visit: Payer: Self-pay | Admitting: *Deleted

## 2019-11-12 DIAGNOSIS — R072 Precordial pain: Secondary | ICD-10-CM

## 2019-11-19 ENCOUNTER — Encounter: Payer: Self-pay | Admitting: Certified Nurse Midwife

## 2019-11-25 LAB — BASIC METABOLIC PANEL
BUN/Creatinine Ratio: 20 (ref 12–28)
BUN: 15 mg/dL (ref 8–27)
CO2: 24 mmol/L (ref 20–29)
Calcium: 9.7 mg/dL (ref 8.7–10.3)
Chloride: 102 mmol/L (ref 96–106)
Creatinine, Ser: 0.75 mg/dL (ref 0.57–1.00)
GFR calc Af Amer: 93 mL/min/{1.73_m2} (ref >59)
GFR calc non Af Amer: 80 mL/min/{1.73_m2} (ref >59)
Glucose: 70 mg/dL (ref 65–99)
Potassium: 4.6 mmol/L (ref 3.5–5.2)
Sodium: 140 mmol/L (ref 134–144)

## 2019-12-05 ENCOUNTER — Telehealth (HOSPITAL_COMMUNITY): Payer: Self-pay | Admitting: Emergency Medicine

## 2019-12-05 ENCOUNTER — Encounter (HOSPITAL_COMMUNITY): Payer: Self-pay

## 2019-12-05 NOTE — Telephone Encounter (Signed)
Attempted to call patient regarding upcoming cardiac CT appointment. °Left message on voicemail with name and callback number °Damaris Abeln RN Navigator Cardiac Imaging °[facility] °[phone] Office °[phone] Cell ° °

## 2019-12-06 ENCOUNTER — Other Ambulatory Visit: Payer: Self-pay

## 2019-12-06 ENCOUNTER — Ambulatory Visit (HOSPITAL_COMMUNITY)
Admission: RE | Admit: 2019-12-06 | Discharge: 2019-12-06 | Disposition: A | Discharge status: Home / Self Care | Payer: Medicare Other | Source: Ambulatory Visit | Attending: Cardiology | Admitting: Cardiology

## 2019-12-06 DIAGNOSIS — R072 Precordial pain: Secondary | ICD-10-CM | POA: Diagnosis present

## 2019-12-06 MED ORDER — IOHEXOL 350 MG/ML SOLN
80.0000 mL | Freq: Once | INTRAVENOUS | Status: AC | PRN
  Administered 2019-12-06: 80 mL via INTRAVENOUS

## 2019-12-06 MED ORDER — NITROGLYCERIN 0.4 MG SL SUBL
0.8000 mg | SUBLINGUAL_TABLET | Freq: Once | SUBLINGUAL | Status: AC
  Administered 2019-12-06: 0.8 mg via SUBLINGUAL

## 2019-12-06 MED ORDER — NITROGLYCERIN 0.4 MG SL SUBL
SUBLINGUAL_TABLET | SUBLINGUAL | Status: AC
  Filled 2019-12-06: qty 2

## 2019-12-11 ENCOUNTER — Other Ambulatory Visit: Payer: Self-pay | Admitting: Podiatry

## 2019-12-11 ENCOUNTER — Other Ambulatory Visit: Payer: Self-pay

## 2019-12-11 ENCOUNTER — Ambulatory Visit: Payer: Medicare HMO | Admitting: Podiatry

## 2019-12-11 ENCOUNTER — Ambulatory Visit (INDEPENDENT_AMBULATORY_CARE_PROVIDER_SITE_OTHER): Payer: Medicare Other

## 2019-12-11 ENCOUNTER — Encounter: Payer: Self-pay | Admitting: Podiatry

## 2019-12-11 VITALS — Temp 97.7°F

## 2019-12-11 DIAGNOSIS — M79672 Pain in left foot: Secondary | ICD-10-CM

## 2019-12-11 DIAGNOSIS — M7752 Other enthesopathy of left foot: Secondary | ICD-10-CM | POA: Diagnosis not present

## 2019-12-11 DIAGNOSIS — S92302A Fracture of unspecified metatarsal bone(s), left foot, initial encounter for closed fracture: Secondary | ICD-10-CM

## 2019-12-11 DIAGNOSIS — M7750 Other enthesopathy of unspecified foot: Secondary | ICD-10-CM

## 2019-12-11 DIAGNOSIS — M722 Plantar fascial fibromatosis: Secondary | ICD-10-CM | POA: Diagnosis not present

## 2019-12-12 NOTE — Progress Notes (Signed)
Subjective:   Patient ID: [name], female   DOB: 74 y.o.   MRN: [id number]   HPI Patient presents with 2 problems with 1 being exquisite discomfort bottom of the left heel and the other being very painful outside the left foot and it feels like she is almost walking on a broken bone but she does not remember specific injury even though she may have turned it and forgot about it.  Patient does not smoke likes to be active   Review of Systems  All other systems reviewed and are negative.       Objective:  Physical Exam Vitals and nursing note reviewed.  Constitutional:      Appearance: She is well-developed.  Pulmonary:     Effort: Pulmonary effort is normal.  Musculoskeletal:        General: Normal range of motion.  Skin:    General: Skin is warm.  Neurological:     Mental Status: She is alert.     Neurovascular status found to be intact muscle strength found to be adequate range of motion within normal limits with discomfort pain fluid buildup around the plantar fascial left at the insertion tendon calcaneus and on the outside of the left foot there is inflammation fluid of the fifth metatarsal shaft that is very painful when pressed and makes walking difficult.  Patient has good digital perfusion well oriented x3     Assessment:  Acute plantar fasciitis left with possibility for fracture of the left fifth metatarsal     Plan:  H&P condition reviewed and today I did sterile prep and injected the fascia 3 mg Kenalog 5 mg Xylocaine and for the fracture of a Jones nature left I did apply air fracture walker and explained these are notorious for not healing and may require other treatment  X-ray indicates that there is a fracture Jones variety left fifth metatarsal which appears to be healing and there is small spur plantar heel with no indication of stress fracture

## 2019-12-13 ENCOUNTER — Telehealth: Payer: Self-pay | Admitting: Podiatry

## 2019-12-13 NOTE — Telephone Encounter (Signed)
Pt called to see if she could change out her boot has a small would like a medium

## 2019-12-13 NOTE — Telephone Encounter (Signed)
Colbert Ewing - Scheduler informed pt the front office staff would help her with a larger boot. I informed front staff of the pt's needs and to get an CMA to assist.

## 2020-01-01 ENCOUNTER — Other Ambulatory Visit: Payer: Self-pay | Admitting: Podiatry

## 2020-01-01 ENCOUNTER — Ambulatory Visit: Payer: Medicare Other | Admitting: Podiatry

## 2020-01-01 ENCOUNTER — Ambulatory Visit (INDEPENDENT_AMBULATORY_CARE_PROVIDER_SITE_OTHER): Payer: Medicare Other

## 2020-01-01 ENCOUNTER — Other Ambulatory Visit: Payer: Self-pay

## 2020-01-01 ENCOUNTER — Encounter: Payer: Self-pay | Admitting: Podiatry

## 2020-01-01 DIAGNOSIS — S92302D Fracture of unspecified metatarsal bone(s), left foot, subsequent encounter for fracture with routine healing: Secondary | ICD-10-CM | POA: Diagnosis not present

## 2020-01-01 DIAGNOSIS — M722 Plantar fascial fibromatosis: Secondary | ICD-10-CM | POA: Diagnosis not present

## 2020-01-01 DIAGNOSIS — S92302A Fracture of unspecified metatarsal bone(s), left foot, initial encounter for closed fracture: Secondary | ICD-10-CM

## 2020-01-01 NOTE — Progress Notes (Signed)
Subjective:   Patient ID: [name], female   DOB: 74 y.o.   MRN: [id number]   HPI Patient presents stating my foot feels somewhat better and I been wearing the boot full-time for the last 3 weeks   ROS      Objective:  Physical Exam  Neurovascular status intact with patient's lateral foot still showing swelling but through from previous visit with diminished discomfort upon palpation with the plantar heel improved after having medication and immobilization     Assessment:  Appears to be improving from fracture of the left fifth metatarsal with plantar fasciitis improved left     Plan:  H&P both conditions reviewed and for the fracture we will continue immobilization for the next several weeks continue ice therapy and gradual return to rigid bottom shoes.  For the plantar fasciitis continue stretching exercises and instructions on anti-inflammatories given to patient

## 2020-09-14 ENCOUNTER — Ambulatory Visit: Payer: Medicare HMO | Admitting: Certified Nurse Midwife

## 2020-09-26 ENCOUNTER — Other Ambulatory Visit: Payer: Self-pay | Admitting: Cardiology

## 2020-11-17 NOTE — Progress Notes (Addendum)
Virtual Visit via Video Note   This visit type was conducted due to national recommendations for restrictions regarding the COVID-19 Pandemic (e.g. social distancing) in an effort to limit this patient's exposure and mitigate transmission in our community.  Due to her co-morbid illnesses, this patient is at least at moderate risk for complications without adequate follow up.  This format is felt to be most appropriate for this patient at this time.  All issues noted in this document were discussed and addressed.  A limited physical exam was performed with this format.  Please refer to the patient's chart for her consent to telehealth for [facility].      Date:  11/19/2020   ID:  [name], DOB 07-10-1946, MRN [id number]  Patient Location:Home Provider Location: office  PCP:  Rodrigo Ran, MD  Cardiologist:  Dr Jens Som  Evaluation Performed:  Follow-Up Visit  Chief Complaint:  FU SVT  History of Present Illness:    FU SVT. Echocardiogram in August of 2007 showed normal LV function. Stress Myoview in 2011 apparently was normal. Exercise treadmill in September of 2014; patient exercised for 9 minutes with no symptoms and normal ST segments.  Cardiac CTA April 2021 showed calcium score of 25 and 1 to 24% calcified plaque in the LAD.  Since she was last seenthe patient denies any dyspnea on exertion, orthopnea, PND, pedal edema, palpitations, syncope or chest pain.   The patient does not have symptoms concerning for COVID-19 infection (fever, chills, cough, or new shortness of breath).    Past Medical History:  Diagnosis Date  . Allergy   . Anxiety   . Bunion   . Collagenous colitis 03/2006  . GERD (gastroesophageal reflux disease)   . Hyperlipidemia   . Osteoporosis 10/10  . Personal history of colonic polyps 03/2006   5 mm rectal adenoma  . Seasonal allergies   . Shingles   . Supraventricular premature beats    Past Surgical History:  Procedure Laterality Date  .  COLONOSCOPY  03/2006, 07/08/2011   2007 - collagenous colitis, 5 mm rectal adenoma 2012 - normal  . FACIAL COSMETIC SURGERY  2012  . WISDOM TOOTH EXTRACTION  age 5     Current Meds  Medication Sig  . aspirin 81 MG tablet Take 81 mg by mouth every other day. 3 days a week  . Cholecalciferol (VITAMIN D PO) Take by mouth.  . Cyanocobalamin (VITAMIN B 12) 100 MCG LOZG Take by mouth.  . escitalopram (LEXAPRO) 20 MG tablet daily.  . metoprolol tartrate (LOPRESSOR) 25 MG tablet TAKE 1 TABLET BY MOUTH TWICE A DAY  . pantoprazole (PROTONIX) 40 MG tablet Take 1 tablet by mouth every other day.   . simvastatin (ZOCOR) 40 MG tablet Take 1 tablet by mouth daily.  . [DISCONTINUED] Bacillus Coagulans-Inulin (ALIGN PREBIOTIC-PROBIOTIC PO) Take by mouth.     Allergies:   Bee venom, Epinephrine, and Neosporin [neomycin-polymyxin-gramicidin]   Social History   Tobacco Use  . Smoking status: Former Smoker    Packs/day: 0.50    Years: 5.00    Pack years: 2.50    Types: Cigarettes    Quit date: 08/29/1969    Years since quitting: 51.2  . Smokeless tobacco: Never Used  Vaping Use  . Vaping Use: Never used  Substance Use Topics  . Alcohol use: Yes    Alcohol/week: 7.0 standard drinks    Types: 7 Glasses of wine per week  . Drug use: No  Family Hx: The patient's family history includes Atrial fibrillation in her father; COPD in her father; Colon cancer in her cousin and maternal aunt; Heart attack in her cousin, maternal grandfather, and paternal grandfather; Hyperlipidemia in her brother, brother, and sister; Lung cancer in her father; Schizophrenia in her sister; Vascular Disease in her mother. There is no history of Diabetes, Esophageal cancer, Rectal cancer, or Stomach cancer.  ROS:   Please see the history of present illness.    No Fever, chills  or productive cough All other systems reviewed and are negative.   Recent Labs: 11/25/2019: BUN 15; Creatinine, Ser 0.75; Potassium 4.6; Sodium  140   Wt Readings from Last 3 Encounters:  11/19/20 136 lb (61.7 kg)  11/04/19 135 lb (61.2 kg)  09/10/19 134 lb (60.8 kg)     Objective:    Vital Signs:  BP 126/67   Pulse 65   Ht 5\' 4"  (1.626 m)   Wt 136 lb (61.7 kg)   LMP 04/29/2006 (Approximate)   BMI 23.34 kg/m    VITAL SIGNS:  reviewed NAD Answers questions appropriately Normal affect Remainder of physical examination not performed (telehealth visit; coronavirus pandemic)  ASSESSMENT & PLAN:    1. SVT-no recurrences based on history.  Continue beta-blocker.  Can consider referral for ablation in the future if episodes become more frequent. 2. Mildly elevated calcium score-continue statin. 3. Hyperlipidemia-continue statin.  COVID-19 Education: The importance of social distancing was discussed today.  Time:   Today, I have spent 16 minutes with the patient with telehealth technology discussing the above problems.     Medication Adjustments/Labs and Tests Ordered: Current medicines are reviewed at length with the patient today.  Concerns regarding medicines are outlined above.   Tests Ordered: No orders of the defined types were placed in this encounter.   Medication Changes: No orders of the defined types were placed in this encounter.   Follow Up:  In Person in 1 year(s)  Signed, 06/29/2006, MD  11/19/2020 8:44 AM    [facility]

## 2020-11-19 ENCOUNTER — Encounter: Payer: Self-pay | Admitting: Cardiology

## 2020-11-19 ENCOUNTER — Telehealth (INDEPENDENT_AMBULATORY_CARE_PROVIDER_SITE_OTHER): Payer: Medicare Other | Admitting: Cardiology

## 2020-11-19 VITALS — BP 126/67 | HR 65 | Ht 64.0 in | Wt 136.0 lb

## 2020-11-19 DIAGNOSIS — I471 Supraventricular tachycardia: Secondary | ICD-10-CM | POA: Diagnosis not present

## 2020-11-19 DIAGNOSIS — I2510 Atherosclerotic heart disease of native coronary artery without angina pectoris: Secondary | ICD-10-CM | POA: Diagnosis not present

## 2020-11-19 DIAGNOSIS — E7800 Pure hypercholesterolemia, unspecified: Secondary | ICD-10-CM | POA: Diagnosis not present

## 2020-11-19 MED ORDER — METOPROLOL TARTRATE 25 MG PO TABS: 25.0000 mg | ORAL_TABLET | Freq: Two times a day (BID) | ORAL | 3 refills | Status: DC

## 2020-11-19 NOTE — Patient Instructions (Signed)

## 2020-12-26 IMAGING — CT CT HEART MORP W/ CTA COR W/ SCORE W/ CA W/CM &/OR W/O CM
4 of 7 series · 8 of 20 positions shown, 9 images · IV contrast (APPLIED)
Comparison: 04/04/2006 chest radiograph.

Addendum:
CLINICAL DATA: Chest pain

EXAM:
Cardiac CTA
MEDICATIONS:
Sub lingual nitro.  4mg
TECHNIQUE: The patient was scanned on a Siemens Force [REDACTED]ice scanner. Gantry
rotation speed was 250 msecs. Collimation was .6 mm. A 100 kV
prospective scan was triggered in the ascending thoracic aorta at
140 HU's Full mA was used between 35% and 75% of the R-R interval.
Average HR during the scan was 52 bpm. The 3D data set was
interpreted on a dedicated work station using MPR, MIP and VRT
modes. A total of 80 cc of contrast was used.

[Series 6: best diast 76 % · axial · 0.39mm/px · z∈[+1088,+1135]mm · 2 of 351 slices shown]
[im 117/351  vessel]
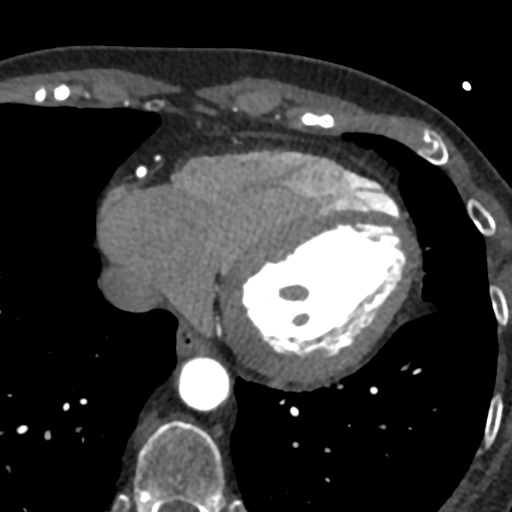
[im 234/351  vessel]
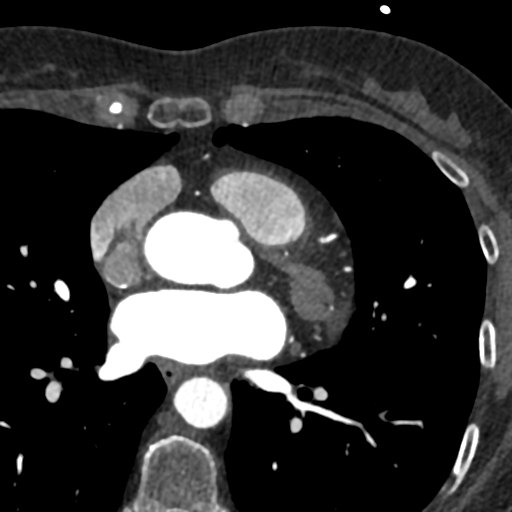

[Series 7: best syst · axial · 0.39mm/px · z∈[+1088,+1135]mm · 2 of 351 slices shown, 3 images]
[im 117/351  vessel]
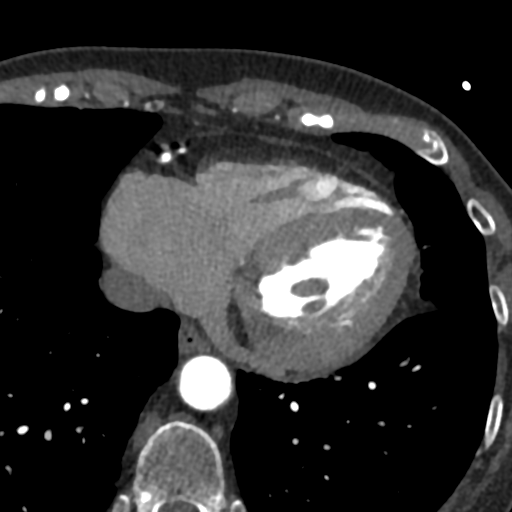
[im 117/351  lung]
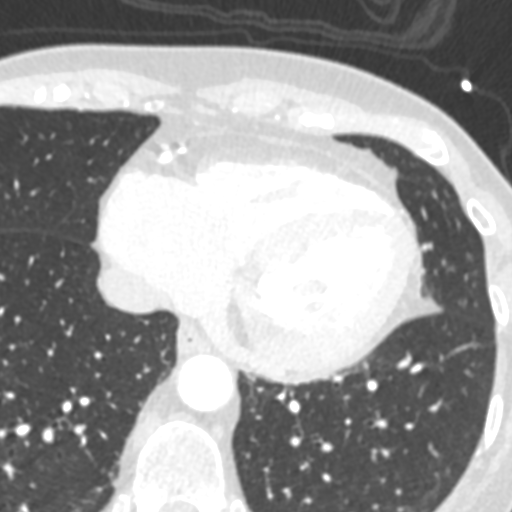
[im 234/351  vessel]
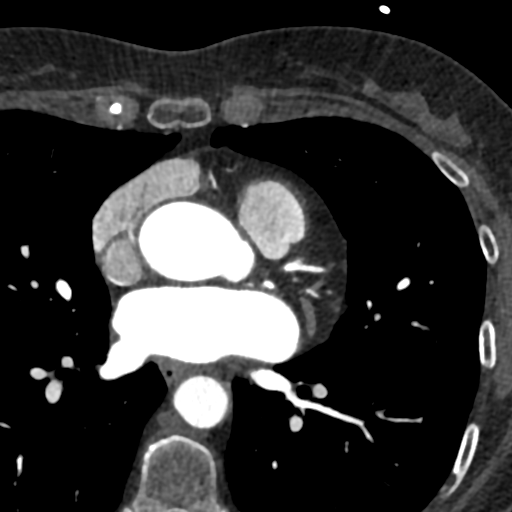

[Series 10: ts diast sharp · axial · 0.39mm/px · z∈[+1088,+1135]mm · 2 of 351 slices shown]
[im 117/351  lung]
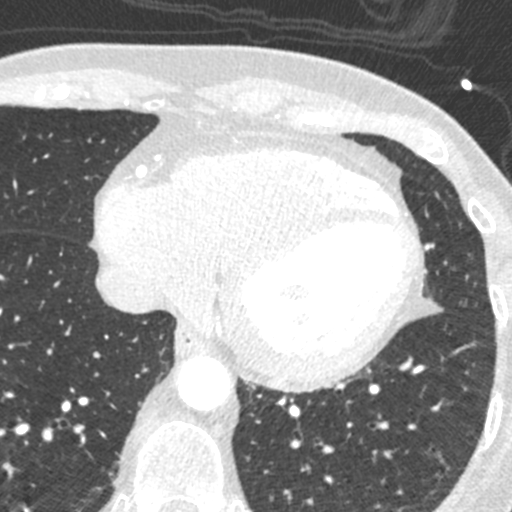
[im 234/351  lung]
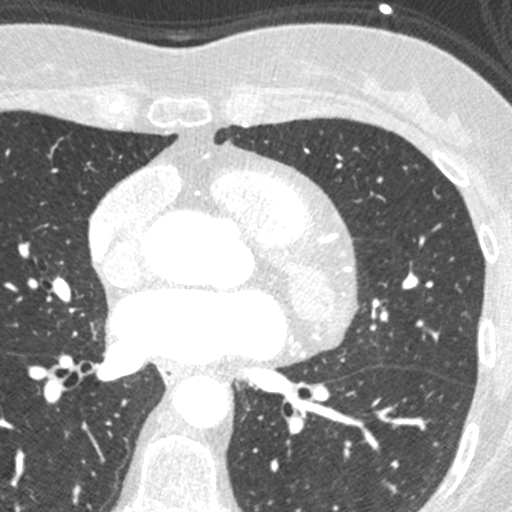

[Series 11: ts syst sharp · axial · 0.39mm/px · z∈[+1088,+1135]mm · 2 of 351 slices shown]
[im 117/351  lung]
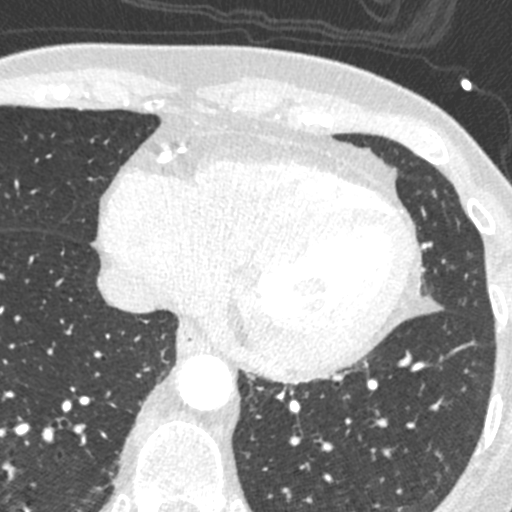
[im 234/351  lung]
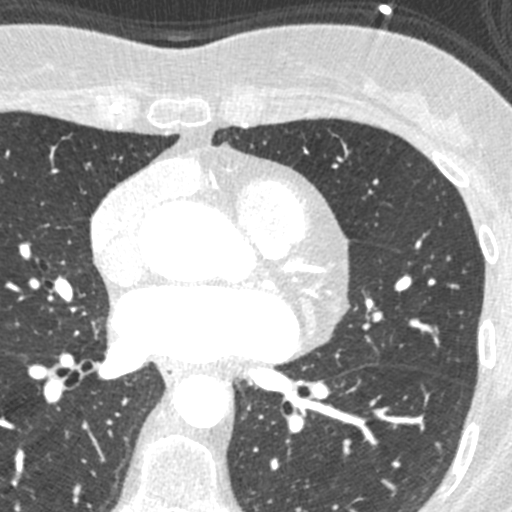

[8 of 20 positions shown; findings below may reference images not displayed]

FINDINGS: Non-cardiac: See separate report from [REDACTED]. No
significant findings on limited lung and soft tissue windows.

Calcium Score: Mild calcium noted in  proximal LAD

Coronary Arteries: Right dominant with no anomalies

LM: Normal

LAD: 1-24% calcified plaque with external remodeling in proximal
vessel

D1: Normal

D2: Normal

Circumflex: Normal

OM1: Normal

OM2: Normal

RCA: Normal

PDA: Normal

PLA: Normal
IMPRESSION: 1. Calcium score 25 isolated to LAD this is only 52 nd percentile
for age/ sex

2.  Normal aortic root diameter 3.6 cm

3.  Non obstructive CAD: CAD-RADS 1 see description above

Quirijn Amazigh

EXAM:
OVER-READ INTERPRETATION  CT CHEST

The following report is an over-read performed by radiologist Dr.
does not include interpretation of cardiac or coronary anatomy or
pathology. The coronary CTA interpretation by the cardiologist is
attached.
FINDINGS: Cardiovascular: Normal heart size. No significant pericardial
effusion/thickening. Great vessels are normal in course and caliber.
No central pulmonary emboli.

Mediastinum/Nodes: Unremarkable esophagus. No pathologically
enlarged mediastinal or hilar lymph nodes.

Lungs/Pleura: No pneumothorax. No pleural effusion. No acute
consolidative airspace disease, lung masses or significant pulmonary
nodules.

Upper abdomen: No acute abnormality.

Musculoskeletal: No aggressive appearing focal osseous lesions.
Moderate thoracic spondylosis.
IMPRESSION: No significant extracardiac findings.

*** End of Addendum ***
FINDINGS: Non-cardiac: See separate report from [REDACTED]. No
significant findings on limited lung and soft tissue windows.

Calcium Score: Mild calcium noted in  proximal LAD

Coronary Arteries: Right dominant with no anomalies

LM: Normal

LAD: 1-24% calcified plaque with external remodeling in proximal
vessel

D1: Normal

D2: Normal

Circumflex: Normal

OM1: Normal

OM2: Normal

RCA: Normal

PDA: Normal

PLA: Normal
IMPRESSION: 1. Calcium score 25 isolated to LAD this is only 52 nd percentile
for age/ sex

2.  Normal aortic root diameter 3.6 cm

3.  Non obstructive CAD: CAD-RADS 1 see description above

Quirijn Amazigh

## 2021-12-10 ENCOUNTER — Other Ambulatory Visit (HOSPITAL_COMMUNITY): Payer: Self-pay | Admitting: *Deleted

## 2021-12-13 ENCOUNTER — Ambulatory Visit (HOSPITAL_COMMUNITY)
Admission: RE | Admit: 2021-12-13 | Discharge: 2021-12-13 | Disposition: A | Discharge status: Home / Self Care | Payer: Medicare Other | Source: Ambulatory Visit | Attending: Internal Medicine | Admitting: Internal Medicine

## 2021-12-13 DIAGNOSIS — M810 Age-related osteoporosis without current pathological fracture: Secondary | ICD-10-CM | POA: Insufficient documentation

## 2021-12-13 MED ORDER — DENOSUMAB 60 MG/ML ~~LOC~~ SOSY
PREFILLED_SYRINGE | SUBCUTANEOUS | Status: AC
  Filled 2021-12-13: qty 1

## 2021-12-13 MED ORDER — DENOSUMAB 60 MG/ML ~~LOC~~ SOSY
60.0000 mg | PREFILLED_SYRINGE | Freq: Once | SUBCUTANEOUS | Status: AC
  Administered 2021-12-13: 60 mg via SUBCUTANEOUS

## 2022-02-03 ENCOUNTER — Other Ambulatory Visit: Payer: Self-pay | Admitting: Cardiology

## 2022-06-16 ENCOUNTER — Encounter (HOSPITAL_COMMUNITY): Payer: Medicare Other

## 2022-06-16 ENCOUNTER — Other Ambulatory Visit (HOSPITAL_COMMUNITY): Payer: Self-pay

## 2022-06-17 ENCOUNTER — Ambulatory Visit (HOSPITAL_COMMUNITY)
Admission: RE | Admit: 2022-06-17 | Discharge: 2022-06-17 | Disposition: A | Discharge status: Home / Self Care | Payer: Medicare Other | Source: Ambulatory Visit | Attending: Internal Medicine | Admitting: Internal Medicine

## 2022-06-17 DIAGNOSIS — M810 Age-related osteoporosis without current pathological fracture: Secondary | ICD-10-CM | POA: Insufficient documentation

## 2022-06-17 MED ORDER — DENOSUMAB 60 MG/ML ~~LOC~~ SOSY
60.0000 mg | PREFILLED_SYRINGE | Freq: Once | SUBCUTANEOUS | Status: AC
  Administered 2022-06-17: 60 mg via SUBCUTANEOUS

## 2022-06-17 MED ORDER — DENOSUMAB 60 MG/ML ~~LOC~~ SOSY
PREFILLED_SYRINGE | SUBCUTANEOUS | Status: AC
  Filled 2022-06-17: qty 1

## 2022-09-09 DIAGNOSIS — I2510 Atherosclerotic heart disease of native coronary artery without angina pectoris: Secondary | ICD-10-CM | POA: Diagnosis not present

## 2022-09-09 DIAGNOSIS — R69 Illness, unspecified: Secondary | ICD-10-CM | POA: Diagnosis not present

## 2022-09-09 DIAGNOSIS — E785 Hyperlipidemia, unspecified: Secondary | ICD-10-CM | POA: Diagnosis not present

## 2022-09-09 DIAGNOSIS — F419 Anxiety disorder, unspecified: Secondary | ICD-10-CM | POA: Diagnosis not present

## 2022-09-09 DIAGNOSIS — R7989 Other specified abnormal findings of blood chemistry: Secondary | ICD-10-CM | POA: Diagnosis not present

## 2022-09-09 DIAGNOSIS — M810 Age-related osteoporosis without current pathological fracture: Secondary | ICD-10-CM | POA: Diagnosis not present

## 2022-09-16 DIAGNOSIS — Z1212 Encounter for screening for malignant neoplasm of rectum: Secondary | ICD-10-CM | POA: Diagnosis not present

## 2022-09-16 DIAGNOSIS — Z23 Encounter for immunization: Secondary | ICD-10-CM | POA: Diagnosis not present

## 2022-09-16 DIAGNOSIS — R82998 Other abnormal findings in urine: Secondary | ICD-10-CM | POA: Diagnosis not present

## 2022-09-16 DIAGNOSIS — Z0000 Encounter for general adult medical examination without abnormal findings: Secondary | ICD-10-CM | POA: Diagnosis not present

## 2022-09-16 DIAGNOSIS — I2510 Atherosclerotic heart disease of native coronary artery without angina pectoris: Secondary | ICD-10-CM | POA: Diagnosis not present

## 2022-09-16 DIAGNOSIS — M810 Age-related osteoporosis without current pathological fracture: Secondary | ICD-10-CM | POA: Diagnosis not present

## 2022-09-16 DIAGNOSIS — G5621 Lesion of ulnar nerve, right upper limb: Secondary | ICD-10-CM | POA: Diagnosis not present

## 2022-09-16 DIAGNOSIS — K219 Gastro-esophageal reflux disease without esophagitis: Secondary | ICD-10-CM | POA: Diagnosis not present

## 2022-09-16 DIAGNOSIS — E785 Hyperlipidemia, unspecified: Secondary | ICD-10-CM | POA: Diagnosis not present

## 2022-09-16 DIAGNOSIS — Z1331 Encounter for screening for depression: Secondary | ICD-10-CM | POA: Diagnosis not present

## 2022-09-16 DIAGNOSIS — Z1339 Encounter for screening examination for other mental health and behavioral disorders: Secondary | ICD-10-CM | POA: Diagnosis not present

## 2022-09-29 DIAGNOSIS — Z0100 Encounter for examination of eyes and vision without abnormal findings: Secondary | ICD-10-CM | POA: Diagnosis not present

## 2022-12-07 DIAGNOSIS — M25512 Pain in left shoulder: Secondary | ICD-10-CM | POA: Diagnosis not present

## 2022-12-12 DIAGNOSIS — G4710 Hypersomnia, unspecified: Secondary | ICD-10-CM | POA: Diagnosis not present

## 2022-12-13 DIAGNOSIS — G4710 Hypersomnia, unspecified: Secondary | ICD-10-CM | POA: Diagnosis not present

## 2022-12-16 ENCOUNTER — Other Ambulatory Visit (HOSPITAL_COMMUNITY): Payer: Self-pay | Admitting: *Deleted

## 2022-12-19 ENCOUNTER — Ambulatory Visit (HOSPITAL_COMMUNITY)
Admission: RE | Admit: 2022-12-19 | Discharge: 2022-12-19 | Disposition: A | Discharge status: Home / Self Care | Payer: Medicare HMO | Source: Ambulatory Visit | Attending: Internal Medicine | Admitting: Internal Medicine

## 2022-12-19 DIAGNOSIS — M810 Age-related osteoporosis without current pathological fracture: Secondary | ICD-10-CM | POA: Insufficient documentation

## 2022-12-19 MED ORDER — DENOSUMAB 60 MG/ML ~~LOC~~ SOSY
60.0000 mg | PREFILLED_SYRINGE | Freq: Once | SUBCUTANEOUS | Status: AC
  Administered 2022-12-19: 60 mg via SUBCUTANEOUS

## 2022-12-19 MED ORDER — DENOSUMAB 60 MG/ML ~~LOC~~ SOSY
PREFILLED_SYRINGE | SUBCUTANEOUS | Status: AC
  Filled 2022-12-19: qty 1

## 2023-02-05 ENCOUNTER — Other Ambulatory Visit: Payer: Self-pay | Admitting: Cardiology

## 2023-02-07 DIAGNOSIS — D2239 Melanocytic nevi of other parts of face: Secondary | ICD-10-CM | POA: Diagnosis not present

## 2023-02-07 DIAGNOSIS — L821 Other seborrheic keratosis: Secondary | ICD-10-CM | POA: Diagnosis not present

## 2023-02-07 DIAGNOSIS — L820 Inflamed seborrheic keratosis: Secondary | ICD-10-CM | POA: Diagnosis not present

## 2023-02-07 DIAGNOSIS — L738 Other specified follicular disorders: Secondary | ICD-10-CM | POA: Diagnosis not present

## 2023-03-02 ENCOUNTER — Other Ambulatory Visit: Payer: Self-pay | Admitting: Cardiology

## 2023-03-20 ENCOUNTER — Other Ambulatory Visit: Payer: Self-pay | Admitting: Cardiology

## 2023-04-13 ENCOUNTER — Other Ambulatory Visit: Payer: Self-pay | Admitting: Cardiology

## 2023-04-13 DIAGNOSIS — Z1231 Encounter for screening mammogram for malignant neoplasm of breast: Secondary | ICD-10-CM | POA: Diagnosis not present

## 2023-04-13 DIAGNOSIS — H6991 Unspecified Eustachian tube disorder, right ear: Secondary | ICD-10-CM | POA: Diagnosis not present

## 2023-04-13 DIAGNOSIS — R5383 Other fatigue: Secondary | ICD-10-CM | POA: Diagnosis not present

## 2023-04-13 DIAGNOSIS — R059 Cough, unspecified: Secondary | ICD-10-CM | POA: Diagnosis not present

## 2023-04-13 DIAGNOSIS — Z1152 Encounter for screening for COVID-19: Secondary | ICD-10-CM | POA: Diagnosis not present

## 2023-04-13 DIAGNOSIS — R0600 Dyspnea, unspecified: Secondary | ICD-10-CM | POA: Diagnosis not present

## 2023-04-13 DIAGNOSIS — J029 Acute pharyngitis, unspecified: Secondary | ICD-10-CM | POA: Diagnosis not present

## 2023-04-13 DIAGNOSIS — R42 Dizziness and giddiness: Secondary | ICD-10-CM | POA: Diagnosis not present

## 2023-04-13 DIAGNOSIS — J329 Chronic sinusitis, unspecified: Secondary | ICD-10-CM | POA: Diagnosis not present

## 2023-04-14 NOTE — Telephone Encounter (Signed)
Patient has requested metoprolol tartrate 25mg . Her last office visit was 11/04/2019. She doesn't have an appointment scheduled.

## 2023-04-26 DIAGNOSIS — H2513 Age-related nuclear cataract, bilateral: Secondary | ICD-10-CM | POA: Diagnosis not present

## 2023-04-26 DIAGNOSIS — H5203 Hypermetropia, bilateral: Secondary | ICD-10-CM | POA: Diagnosis not present

## 2023-04-26 DIAGNOSIS — H31002 Unspecified chorioretinal scars, left eye: Secondary | ICD-10-CM | POA: Diagnosis not present

## 2023-05-03 NOTE — Progress Notes (Signed)
HPI: FU SVT. Echocardiogram in August of 2007 showed normal LV function. Stress Myoview in 2011 apparently was normal. Exercise treadmill in September of 2014; patient exercised for 9 minutes with no symptoms and normal ST segments.  Cardiac CTA April 2021 showed calcium score of 25 and 1 to 24% calcified plaque in the LAD.  Since she was last see the patient denies any dyspnea on exertion, orthopnea, PND, pedal edema, palpitations, syncope or chest pain.   Current Outpatient Medications  Medication Sig Dispense Refill   aspirin 81 MG tablet Take 81 mg by mouth every other day. 3 days a week     Cholecalciferol (VITAMIN D PO) Take by mouth.     Cyanocobalamin (VITAMIN B 12) 100 MCG LOZG Take by mouth.     denosumab (PROLIA) 60 MG/ML SOSY injection Inject 60 mg into the skin every 6 (six) months.     diphenhydrAMINE (BENADRYL ALLERGY) 25 MG tablet Take 25 mg by mouth daily as needed.     EPINEPHrine 0.3 mg/0.3 mL IJ SOAJ injection Inject 0.3 mg into the muscle as needed.     Ergocalciferol 10 MCG (400 UNIT) TABS Take by mouth.     escitalopram (LEXAPRO) 20 MG tablet daily.     ezetimibe (ZETIA) 10 MG tablet Take 10 mg by mouth daily.     metoprolol tartrate (LOPRESSOR) 25 MG tablet TAKE 1 TABLET BY MOUTH TWICE A DAY 180 tablet 1   metroNIDAZOLE (METROGEL) 1 % gel Apply 1 Application topically daily as needed.     pantoprazole (PROTONIX) 40 MG tablet Take 1 tablet by mouth every other day.      Probiotic, Lactobacillus, CAPS Take by mouth.     simvastatin (ZOCOR) 40 MG tablet Take 1 tablet by mouth daily.     No current facility-administered medications for this visit.     Past Medical History:  Diagnosis Date   Allergy    Anxiety    Bunion    Collagenous colitis 03/2006   GERD (gastroesophageal reflux disease)    Hyperlipidemia    Osteoporosis 10/10   Personal history of colonic polyps 03/2006   5 mm rectal adenoma   Seasonal allergies    Shingles    Supraventricular  premature beats     Past Surgical History:  Procedure Laterality Date   COLONOSCOPY  03/2006, 07/08/2011   2007 - collagenous colitis, 5 mm rectal adenoma 2012 - normal   FACIAL COSMETIC SURGERY  2012   WISDOM TOOTH EXTRACTION  age 15    Social History   Socioeconomic History   Marital status: Married    Spouse name: Rosanne Ashing   Number of children: 2   Years of education: Not on file   Highest education level: Not on file  Occupational History   Occupation: retired  Tobacco Use   Smoking status: Former    Current packs/day: 0.00    Average packs/day: 0.5 packs/day for 5.0 years (2.5 ttl pk-yrs)    Types: Cigarettes    Start date: 08/29/1964    Quit date: 08/29/1969    Years since quitting: 53.7   Smokeless tobacco: Never  Vaping Use   Vaping status: Never Used  Substance and Sexual Activity   Alcohol use: Yes    Alcohol/week: 7.0 standard drinks of alcohol    Types: 7 Glasses of wine per week   Drug use: No   Sexual activity: Not Currently    Partners: Male    Birth control/protection: Post-menopausal  Other Topics Concern   Not on file  Social History Narrative   Married, retired Wellsite geologist   2 daughters, 4 grandchildren   Son-in-law Dr. Aggie Hacker of  [facility]   Social Determinants of Health   Financial Resource Strain: Not on file  Food Insecurity: Not on file  Transportation Needs: Not on file  Physical Activity: Not on file  Stress: Not on file  Social Connections: Not on file  Intimate Partner Violence: Not on file    Family History  Problem Relation Age of Onset   Lung cancer Father        died at age 32, was a smoker   COPD Father    Atrial fibrillation Father    Heart attack Paternal Grandfather    Heart attack Maternal Grandfather    Vascular Disease Mother    Hyperlipidemia Sister    Schizophrenia Sister    Hyperlipidemia Brother    Hyperlipidemia Brother    Heart attack Cousin    Colon cancer Cousin    Colon cancer Maternal Aunt     Diabetes Neg Hx    Esophageal cancer Neg Hx    Rectal cancer Neg Hx    Stomach cancer Neg Hx     ROS: no fevers or chills, productive cough, hemoptysis, dysphasia, odynophagia, melena, hematochezia, dysuria, hematuria, rash, seizure activity, orthopnea, PND, pedal edema, claudication. Remaining systems are negative.  Physical Exam: Well-developed well-nourished in no acute distress.  Skin is warm and dry.  HEENT is normal.  Neck is supple.  Chest is clear to auscultation with normal expansion.  Cardiovascular exam is regular rate and rhythm.  Abdominal exam nontender or distended. No masses palpated. Extremities show no edema. neuro grossly intact  EKG Interpretation Date/Time:  Tuesday May 09 2023 12:10:02 EDT Ventricular Rate:  62 PR Interval:  190 QRS Duration:  98 QT Interval:  400 QTC Calculation: 406 R Axis:   -47  Text Interpretation: Normal sinus rhythm Left axis deviation Incomplete right bundle branch block Septal infarct , age undetermined No previous ECGs available Confirmed by Olga Millers ([id number]) on 05/09/2023 12:14:08 PM    A/P  1 supraventricular tachycardia-patient denies any recurrences.  Continue beta-blocker at present dose.  Will consider referral for ablation if she has more frequent episodes in the future.  2 history of mildly elevated calcium score-patient denies chest pain.  Continue statin.  3 hyperlipidemia-continue zetia and zocor.  Olga Millers, MD

## 2023-05-09 ENCOUNTER — Encounter: Payer: Self-pay | Admitting: Cardiology

## 2023-05-09 ENCOUNTER — Ambulatory Visit: Payer: Medicare HMO | Attending: Cardiology | Admitting: Cardiology

## 2023-05-09 VITALS — BP 126/76 | HR 62 | Ht 64.0 in | Wt 137.0 lb

## 2023-05-09 DIAGNOSIS — I471 Supraventricular tachycardia, unspecified: Secondary | ICD-10-CM

## 2023-05-09 DIAGNOSIS — E7800 Pure hypercholesterolemia, unspecified: Secondary | ICD-10-CM

## 2023-05-09 DIAGNOSIS — I2584 Coronary atherosclerosis due to calcified coronary lesion: Secondary | ICD-10-CM

## 2023-05-09 DIAGNOSIS — I2510 Atherosclerotic heart disease of native coronary artery without angina pectoris: Secondary | ICD-10-CM

## 2023-05-31 ENCOUNTER — Emergency Department (HOSPITAL_BASED_OUTPATIENT_CLINIC_OR_DEPARTMENT_OTHER): Payer: Medicare HMO

## 2023-05-31 ENCOUNTER — Other Ambulatory Visit: Payer: Self-pay

## 2023-05-31 ENCOUNTER — Emergency Department (HOSPITAL_BASED_OUTPATIENT_CLINIC_OR_DEPARTMENT_OTHER)
Admission: EM | Admit: 2023-05-31 | Discharge: 2023-05-31 | Disposition: A | Discharge status: Home / Self Care | Payer: Medicare HMO | Attending: Emergency Medicine | Admitting: Emergency Medicine

## 2023-05-31 ENCOUNTER — Encounter (HOSPITAL_BASED_OUTPATIENT_CLINIC_OR_DEPARTMENT_OTHER): Payer: Self-pay | Admitting: Emergency Medicine

## 2023-05-31 DIAGNOSIS — I10 Essential (primary) hypertension: Secondary | ICD-10-CM | POA: Diagnosis not present

## 2023-05-31 DIAGNOSIS — S199XXA Unspecified injury of neck, initial encounter: Secondary | ICD-10-CM | POA: Diagnosis not present

## 2023-05-31 DIAGNOSIS — Z7982 Long term (current) use of aspirin: Secondary | ICD-10-CM | POA: Insufficient documentation

## 2023-05-31 DIAGNOSIS — Z79899 Other long term (current) drug therapy: Secondary | ICD-10-CM | POA: Diagnosis not present

## 2023-05-31 DIAGNOSIS — G319 Degenerative disease of nervous system, unspecified: Secondary | ICD-10-CM | POA: Diagnosis not present

## 2023-05-31 DIAGNOSIS — S0990XA Unspecified injury of head, initial encounter: Secondary | ICD-10-CM | POA: Diagnosis not present

## 2023-05-31 DIAGNOSIS — W11XXXA Fall on and from ladder, initial encounter: Secondary | ICD-10-CM | POA: Diagnosis not present

## 2023-05-31 DIAGNOSIS — S0101XA Laceration without foreign body of scalp, initial encounter: Secondary | ICD-10-CM | POA: Insufficient documentation

## 2023-05-31 DIAGNOSIS — W19XXXA Unspecified fall, initial encounter: Secondary | ICD-10-CM

## 2023-05-31 MED ORDER — ACETAMINOPHEN 500 MG PO TABS
1000.0000 mg | ORAL_TABLET | Freq: Once | ORAL | Status: AC
  Administered 2023-05-31: 1000 mg via ORAL
  Filled 2023-05-31: qty 2

## 2023-05-31 NOTE — Discharge Instructions (Signed)
Today your CAT scans were reassuring, there is no evidence of head bleed, or trauma to your neck.  You did sustain a laceration to the back of your scalp, you had staples placed.  They will need to be removed in 10 days.  Return to the ER if the area becomes red, swollen, or tender to the touch or you have intractable nausea, vomiting or severe headache.  You can shower, just keep the area clean and dry as possible, throughout the day

## 2023-05-31 NOTE — ED Provider Notes (Signed)
[place] [facility]GENCY DEPARTMENT AT [facility] Provider Note   CSN: [id number] Arrival date & time: 05/31/23  1636     History  Chief Complaint  Patient presents with   [name] is a 77 y.o. female, HTN, HLD, who presents to the ED secondary to head injury that occurred about an hour ago.  States she was on a ladder, cutting some shrubs, and stretched out too far, and excellently fell backwards, and hit her head on a rock.  She states that she has a mild headache, but denies any nausea, vomiting, changes in vision, weakness on one side of the body.  Did not have any chest pain, shortness of breath, or abdominal pain prior to the fall.  No use of blood thinners, no loss of consciousness.  Tetanus last done in 2019.  Denies any pain on the chest, back, abdomen.  Home Medications Prior to Admission medications   Medication Sig Start Date End Date Taking? Authorizing Provider  aspirin 81 MG tablet Take 81 mg by mouth every other day. 3 days a week    [provider]  Cholecalciferol (VITAMIN D PO) Take by mouth.    [provider]  Cyanocobalamin (VITAMIN B 12) 100 MCG LOZG Take by mouth.    [provider]  denosumab (PROLIA) 60 MG/ML SOSY injection Inject 60 mg into the skin every 6 (six) months.    [provider]  diphenhydrAMINE (BENADRYL ALLERGY) 25 MG tablet Take 25 mg by mouth daily as needed. 03/29/17   [provider]  EPINEPHrine 0.3 mg/0.3 mL IJ SOAJ injection Inject 0.3 mg into the muscle as needed.    [provider]  Ergocalciferol 10 MCG (400 UNIT) TABS Take by mouth. 06/13/18   [provider]  escitalopram (LEXAPRO) 20 MG tablet daily. 10/02/16   [provider]  ezetimibe (ZETIA) 10 MG tablet Take 10 mg by mouth daily.    [provider]  metoprolol tartrate (LOPRESSOR) 25 MG tablet TAKE 1 TABLET BY MOUTH TWICE A DAY 04/14/23   Lewayne Bunting, MD  metroNIDAZOLE (METROGEL) 1  % gel Apply 1 Application topically daily as needed.    [provider]  pantoprazole (PROTONIX) 40 MG tablet Take 1 tablet by mouth every other day.  07/28/16   [provider]  Probiotic, Lactobacillus, CAPS Take by mouth. 06/13/18   [provider]  simvastatin (ZOCOR) 40 MG tablet Take 1 tablet by mouth daily. 07/28/16   [provider]      Allergies    Bee venom, Epinephrine, and Neosporin [neomycin-polymyxin-gramicidin]    Review of Systems   Review of Systems  Constitutional:  Negative for chills and fever.  HENT:  Negative for ear pain and sore throat.   Eyes:  Negative for pain and visual disturbance.  Respiratory:  Negative for cough and shortness of breath.   Cardiovascular:  Negative for chest pain and palpitations.  Gastrointestinal:  Negative for abdominal pain and vomiting.  Genitourinary:  Negative for dysuria and hematuria.  Musculoskeletal:  Negative for arthralgias and back pain.  Skin:  Negative for color change and rash.  Neurological:  Positive for headaches. Negative for seizures and syncope.  All other systems reviewed and are negative.   Physical Exam Updated Vital Signs BP (!) 156/73 (BP Location: Right Arm)   Pulse 61   Temp 98.2 F (36.8 C) (Oral)   Resp 16   LMP 04/29/2006 (Approximate)   SpO2  95%  Physical Exam Vitals and nursing note reviewed.  Constitutional:      General: She is not in acute distress.    Appearance: She is well-developed.  HENT:     Head: Normocephalic and atraumatic.     Right Ear: Tympanic membrane normal.     Left Ear: Tympanic membrane normal.     Nose: Nose normal.     Mouth/Throat:     Mouth: Mucous membranes are moist.     Comments: No blood in oropharynx, no fractured teeth Eyes:     Conjunctiva/sclera: Conjunctivae normal.  Cardiovascular:     Rate and Rhythm: Normal rate and regular rhythm.     Heart sounds: No murmur heard. Pulmonary:     Effort: Pulmonary effort is  normal. No respiratory distress.     Breath sounds: Normal breath sounds.  Abdominal:     Palpations: Abdomen is soft.     Tenderness: There is no abdominal tenderness.  Musculoskeletal:        General: No swelling.     Cervical back: Neck supple.     Comments: Mild tenderness to palpation of cervical spine midline.  No step-offs, no thoracic or lumbar tenderness to palpation  Skin:    General: Skin is warm and dry.     Capillary Refill: Capillary refill takes less than 2 seconds.     Comments: No bruising of the chest, abdomen, or back. 1.5cm laceration to posterior scalp.  No hematoma noted  Neurological:     Mental Status: She is alert.  Psychiatric:        Mood and Affect: Mood normal.     ED Results / Procedures / Treatments   Labs (all labs ordered are listed, but only abnormal results are displayed) Labs Reviewed - No data to display  EKG None  Radiology CT Head Wo Contrast  Result Date: 05/31/2023 CLINICAL DATA:  Larey Seat from ladder, twisted around and hit head on a rock. Neck trauma midline tenderness EXAM: CT HEAD WITHOUT CONTRAST CT CERVICAL SPINE WITHOUT CONTRAST TECHNIQUE: Multidetector CT imaging of the head and cervical spine was performed following the standard protocol without intravenous contrast. Multiplanar CT image reconstructions of the cervical spine were also generated. RADIATION DOSE REDUCTION: This exam was performed according to the departmental dose-optimization program which includes automated exposure control, adjustment of the mA and/or kV according to patient size and/or use of iterative reconstruction technique. COMPARISON:  None Available. FINDINGS: CT HEAD FINDINGS Brain: No intracranial hemorrhage, mass effect, or evidence of acute infarct. No hydrocephalus. No extra-axial fluid collection. Age related cerebral atrophy and chronic Sharnay Cashion vessel ischemic disease. Vascular: No hyperdense vessel. Intracranial arterial calcification. Skull: No fracture or  focal lesion. Laceration/contusion to the left posterior scalp. Sinuses/Orbits: No acute finding. Other: None. CT CERVICAL SPINE FINDINGS Alignment: No evidence of traumatic malalignment. Skull base and vertebrae: No acute fracture. No primary bone lesion or focal pathologic process. Soft tissues and spinal canal: No prevertebral fluid or swelling. No visible canal hematoma. Disc levels: Multilevel spondylosis, disc space height loss, and degenerative endplate changes greatest at C5-C6, C6-C7, and C7-T1 where it is moderate. Upper chest: No acute abnormality. Biapical pleural-parenchymal scarring Other: Carotid calcification. IMPRESSION: 1. No acute intracranial abnormality. 2. Laceration/contusion to the left posterior scalp. No calvarial fracture. 3. No acute fracture in the cervical spine. Electronically Signed   By: Minerva Fester M.D.   On: 05/31/2023 18:25   CT Cervical Spine Wo Contrast  Result Date: 05/31/2023 CLINICAL DATA:  Fell from ladder, twisted around and hit head on a rock. Neck trauma midline tenderness EXAM: CT HEAD WITHOUT CONTRAST CT CERVICAL SPINE WITHOUT CONTRAST TECHNIQUE: Multidetector CT imaging of the head and cervical spine was performed following the standard protocol without intravenous contrast. Multiplanar CT image reconstructions of the cervical spine were also generated. RADIATION DOSE REDUCTION: This exam was performed according to the departmental dose-optimization program which includes automated exposure control, adjustment of the mA and/or kV according to patient size and/or use of iterative reconstruction technique. COMPARISON:  None Available. FINDINGS: CT HEAD FINDINGS Brain: No intracranial hemorrhage, mass effect, or evidence of acute infarct. No hydrocephalus. No extra-axial fluid collection. Age related cerebral atrophy and chronic Allecia Bells vessel ischemic disease. Vascular: No hyperdense vessel. Intracranial arterial calcification. Skull: No fracture or focal lesion.  Laceration/contusion to the left posterior scalp. Sinuses/Orbits: No acute finding. Other: None. CT CERVICAL SPINE FINDINGS Alignment: No evidence of traumatic malalignment. Skull base and vertebrae: No acute fracture. No primary bone lesion or focal pathologic process. Soft tissues and spinal canal: No prevertebral fluid or swelling. No visible canal hematoma. Disc levels: Multilevel spondylosis, disc space height loss, and degenerative endplate changes greatest at C5-C6, C6-C7, and C7-T1 where it is moderate. Upper chest: No acute abnormality. Biapical pleural-parenchymal scarring Other: Carotid calcification. IMPRESSION: 1. No acute intracranial abnormality. 2. Laceration/contusion to the left posterior scalp. No calvarial fracture. 3. No acute fracture in the cervical spine. Electronically Signed   By: Minerva Fester M.D.   On: 05/31/2023 18:25    Procedures .Marland KitchenLaceration Repair  Date/Time: 05/31/2023 6:14 PM  Performed by: Pete Pelt, PA Authorized by: Pete Pelt, PA   Consent:    Consent obtained:  Verbal   Consent given by:  Patient   Risks, benefits, and alternatives were discussed: yes     Risks discussed:  Infection, need for additional repair, nerve damage, poor wound healing, poor cosmetic result, pain, retained foreign body, tendon damage and vascular damage   Alternatives discussed:  No treatment Universal protocol:    Patient identity confirmed:  Verbally with patient Anesthesia:    Anesthesia method:  None Laceration details:    Location:  Scalp   Scalp location:  Occipital   Length (cm):  1.5 Treatment:    Area cleansed with:  Chlorhexidine   Amount of cleaning:  Standard   Irrigation solution:  Sterile saline   Irrigation method:  Pressure wash Skin repair:    Repair method:  Staples   Number of staples:  2 Approximation:    Approximation:  Close Repair type:    Repair type:  Simple Post-procedure details:    Dressing:  Open (no dressing)      Medications Ordered in ED Medications  acetaminophen (TYLENOL) tablet 1,000 mg (1,000 mg Oral Given 05/31/23 1825)    ED Course/ Medical Decision Making/ A&P                                 Medical Decision Making Patient is a 77 year old female, here for a fall that occurred about an hour ago.  She states she fell from a ladder about 4 feet off the ground, and fell into a bush and hit her head on a rock.  She has a laceration to her scalp, which I repaired with staples.  Her last tetanus was in 2019, she declines a new one.  We will obtain a head CT, neck CT, given  the fall, and age over 65+.  She has no pain to the chest, abdomen, arms, legs, or hands or feet.  Is overall well-appearing  Amount and/or Complexity of Data Reviewed Radiology: ordered.    Details: Head CT and neck CT unremarkable except for laceration to posterior left scalp Discussion of management or test interpretation with external provider(s): Head CT and neck CT unremarkable, laceration repaired.  Tetanus is up-to-date.  We discussed return precautions, and she voiced understanding.  Given Tylenol for here.  Will need to have staples taken out in 10 days, return precautions emphasized.  Her fall appears to be a mechanical fall, thus we held off on any labs at this time  Risk OTC drugs.    Final Clinical Impression(s) / ED Diagnoses Final diagnoses:  Fall, initial encounter  Injury of head, initial encounter    Rx / DC Orders ED Discharge Orders     None         Pearson Reasons, Harley [place], PA 05/31/23 1852    Rozelle Logan, DO 05/31/23 1926

## 2023-05-31 NOTE — ED Triage Notes (Signed)
Fell from ladder,twisted around and hit head on a rock. Fell at least 4 feet. No LOC.

## 2023-06-12 DIAGNOSIS — W11XXXA Fall on and from ladder, initial encounter: Secondary | ICD-10-CM | POA: Diagnosis not present

## 2023-06-12 DIAGNOSIS — Z23 Encounter for immunization: Secondary | ICD-10-CM | POA: Diagnosis not present

## 2023-06-12 DIAGNOSIS — Z4802 Encounter for removal of sutures: Secondary | ICD-10-CM | POA: Diagnosis not present

## 2023-06-12 DIAGNOSIS — L84 Corns and callosities: Secondary | ICD-10-CM | POA: Diagnosis not present

## 2023-06-12 DIAGNOSIS — S0181XA Laceration without foreign body of other part of head, initial encounter: Secondary | ICD-10-CM | POA: Diagnosis not present

## 2023-09-26 ENCOUNTER — Other Ambulatory Visit: Payer: Self-pay | Admitting: Cardiology

## 2023-11-23 DIAGNOSIS — K219 Gastro-esophageal reflux disease without esophagitis: Secondary | ICD-10-CM | POA: Diagnosis not present

## 2023-11-23 DIAGNOSIS — E785 Hyperlipidemia, unspecified: Secondary | ICD-10-CM | POA: Diagnosis not present

## 2023-11-23 DIAGNOSIS — Z1212 Encounter for screening for malignant neoplasm of rectum: Secondary | ICD-10-CM | POA: Diagnosis not present

## 2023-11-23 DIAGNOSIS — M810 Age-related osteoporosis without current pathological fracture: Secondary | ICD-10-CM | POA: Diagnosis not present

## 2023-11-23 DIAGNOSIS — I2510 Atherosclerotic heart disease of native coronary artery without angina pectoris: Secondary | ICD-10-CM | POA: Diagnosis not present

## 2023-11-24 DIAGNOSIS — L720 Epidermal cyst: Secondary | ICD-10-CM | POA: Diagnosis not present

## 2023-11-24 DIAGNOSIS — L821 Other seborrheic keratosis: Secondary | ICD-10-CM | POA: Diagnosis not present

## 2023-11-24 DIAGNOSIS — D23111 Other benign neoplasm of skin of right upper eyelid, including canthus: Secondary | ICD-10-CM | POA: Diagnosis not present

## 2023-11-24 DIAGNOSIS — Z85828 Personal history of other malignant neoplasm of skin: Secondary | ICD-10-CM | POA: Diagnosis not present

## 2023-11-24 DIAGNOSIS — L718 Other rosacea: Secondary | ICD-10-CM | POA: Diagnosis not present

## 2023-11-24 DIAGNOSIS — L309 Dermatitis, unspecified: Secondary | ICD-10-CM | POA: Diagnosis not present

## 2023-11-28 DIAGNOSIS — G5621 Lesion of ulnar nerve, right upper limb: Secondary | ICD-10-CM | POA: Diagnosis not present

## 2023-11-28 DIAGNOSIS — M810 Age-related osteoporosis without current pathological fracture: Secondary | ICD-10-CM | POA: Diagnosis not present

## 2023-11-28 DIAGNOSIS — M25551 Pain in right hip: Secondary | ICD-10-CM | POA: Diagnosis not present

## 2023-11-28 DIAGNOSIS — B009 Herpesviral infection, unspecified: Secondary | ICD-10-CM | POA: Diagnosis not present

## 2023-11-28 DIAGNOSIS — I493 Ventricular premature depolarization: Secondary | ICD-10-CM | POA: Diagnosis not present

## 2023-11-28 DIAGNOSIS — M722 Plantar fascial fibromatosis: Secondary | ICD-10-CM | POA: Diagnosis not present

## 2023-11-28 DIAGNOSIS — E785 Hyperlipidemia, unspecified: Secondary | ICD-10-CM | POA: Diagnosis not present

## 2023-11-28 DIAGNOSIS — I2510 Atherosclerotic heart disease of native coronary artery without angina pectoris: Secondary | ICD-10-CM | POA: Diagnosis not present

## 2023-11-28 DIAGNOSIS — Z0000 Encounter for general adult medical examination without abnormal findings: Secondary | ICD-10-CM | POA: Diagnosis not present

## 2023-11-28 DIAGNOSIS — R82998 Other abnormal findings in urine: Secondary | ICD-10-CM | POA: Diagnosis not present

## 2023-11-28 DIAGNOSIS — F419 Anxiety disorder, unspecified: Secondary | ICD-10-CM | POA: Diagnosis not present

## 2023-11-28 DIAGNOSIS — Z1212 Encounter for screening for malignant neoplasm of rectum: Secondary | ICD-10-CM | POA: Diagnosis not present

## 2023-11-28 DIAGNOSIS — K219 Gastro-esophageal reflux disease without esophagitis: Secondary | ICD-10-CM | POA: Diagnosis not present

## 2023-12-29 ENCOUNTER — Other Ambulatory Visit (HOSPITAL_COMMUNITY): Payer: Self-pay | Admitting: *Deleted

## 2023-12-29 ENCOUNTER — Ambulatory Visit (HOSPITAL_COMMUNITY)
Admission: RE | Admit: 2023-12-29 | Discharge: 2023-12-29 | Disposition: A | Discharge status: Home / Self Care | Source: Ambulatory Visit | Attending: Internal Medicine | Admitting: Internal Medicine

## 2023-12-29 DIAGNOSIS — Z79620 Long term (current) use of immunosuppressive biologic: Secondary | ICD-10-CM | POA: Insufficient documentation

## 2023-12-29 DIAGNOSIS — M810 Age-related osteoporosis without current pathological fracture: Secondary | ICD-10-CM | POA: Insufficient documentation

## 2023-12-29 MED ORDER — DENOSUMAB 60 MG/ML ~~LOC~~ SOSY
60.0000 mg | PREFILLED_SYRINGE | Freq: Once | SUBCUTANEOUS | Status: AC
  Administered 2023-12-29: 60 mg via SUBCUTANEOUS

## 2023-12-29 MED ORDER — DENOSUMAB 60 MG/ML ~~LOC~~ SOSY
PREFILLED_SYRINGE | SUBCUTANEOUS | Status: AC
  Filled 2023-12-29: qty 1

## 2024-01-01 ENCOUNTER — Encounter (HOSPITAL_COMMUNITY)

## 2024-01-25 DIAGNOSIS — M810 Age-related osteoporosis without current pathological fracture: Secondary | ICD-10-CM | POA: Diagnosis not present

## 2024-02-07 DIAGNOSIS — R058 Other specified cough: Secondary | ICD-10-CM | POA: Diagnosis not present

## 2024-02-07 DIAGNOSIS — R5383 Other fatigue: Secondary | ICD-10-CM | POA: Diagnosis not present

## 2024-02-07 DIAGNOSIS — R0981 Nasal congestion: Secondary | ICD-10-CM | POA: Diagnosis not present

## 2024-02-07 DIAGNOSIS — Z1152 Encounter for screening for COVID-19: Secondary | ICD-10-CM | POA: Diagnosis not present

## 2024-02-07 DIAGNOSIS — J3489 Other specified disorders of nose and nasal sinuses: Secondary | ICD-10-CM | POA: Diagnosis not present

## 2024-02-07 DIAGNOSIS — H9203 Otalgia, bilateral: Secondary | ICD-10-CM | POA: Diagnosis not present

## 2024-04-02 ENCOUNTER — Ambulatory Visit: Admitting: Orthopaedic Surgery

## 2024-04-02 ENCOUNTER — Ambulatory Visit (INDEPENDENT_AMBULATORY_CARE_PROVIDER_SITE_OTHER): Admitting: Sports Medicine

## 2024-04-02 ENCOUNTER — Ambulatory Visit (INDEPENDENT_AMBULATORY_CARE_PROVIDER_SITE_OTHER): Payer: Self-pay

## 2024-04-02 ENCOUNTER — Other Ambulatory Visit: Payer: Self-pay

## 2024-04-02 DIAGNOSIS — M25551 Pain in right hip: Secondary | ICD-10-CM

## 2024-04-02 MED ORDER — METHYLPREDNISOLONE ACETATE 40 MG/ML IJ SUSP
80.0000 mg | INTRAMUSCULAR | Status: AC | PRN
  Administered 2024-04-02: 80 mg via INTRA_ARTICULAR

## 2024-04-02 MED ORDER — LIDOCAINE HCL 1 % IJ SOLN
4.0000 mL | INTRAMUSCULAR | Status: AC | PRN
  Administered 2024-04-02: 4 mL

## 2024-04-02 NOTE — Progress Notes (Signed)
 Office Visit Note   Patient: [name]           Date of Birth: 09/06/1945           MRN: [id number] Visit Date: 04/02/2024              Requested by: Shayne Anes, MD [address] [place],  [place] [address] PCP: Shayne Anes, MD   Assessment & Plan: Visit Diagnoses:  1. Pain in right hip     Plan: History of Present Illness [name] is a 78 year old female with a history of osteoporosis who presents with pain radiating from the hip to the knee.  She has experienced this pain for the past month and a half, described as shooting from the hip, wrapping around, and extending to the thigh and knee. The pain is particularly noticeable when ascending stairs, requiring her to use a railing for support. She sometimes steps up with her non-sore leg first due to the discomfort. There is no associated numbness or tingling.  She sleeps with a king-size pillow between her legs, which she believes helps alleviate some discomfort. She is currently on Prolia  for osteoporosis. She recalls previous successful experiences with cortisone shots for other conditions, such as a frozen shoulder and a foot injury.  Physical Exam MUSCULOSKELETAL: Hip flexibility normal. Pain in thigh with stinchfield test.  Slight pain with FADIR.  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 X-rays confirm mild hip arthritis and lumbar osteoporosis. Differential includes muscular or referred pain. - Refer to Dr. Burnetta for intraarticular hip steroid injection today. - Consider anti-inflammatory medication for two weeks as alternative. - Advise monitoring injection effectiveness within two weeks and report if no improvement.  Follow-Up Instructions: No follow-ups on file.   Orders:  Orders Placed This Encounter  Procedures   XR HIP UNILAT W OR W/O  PELVIS 1V RIGHT   No orders of the defined types were placed in this encounter.     Procedures: No procedures performed   Clinical Data: No additional findings.   Subjective: Chief Complaint  Patient presents with   Right Hip - Pain    HPI  Review of Systems  Constitutional: Negative.   HENT: Negative.    Eyes: Negative.   Respiratory: Negative.    Cardiovascular: Negative.   Endocrine: Negative.   Musculoskeletal: Negative.   Neurological: Negative.   Hematological: Negative.   Psychiatric/Behavioral: Negative.    All other systems reviewed and are negative.    Objective: Vital Signs: LMP 04/29/2006 (Approximate)   Physical Exam Vitals and nursing note reviewed.  Constitutional:      Appearance: She is well-developed.  HENT:     Head: Atraumatic.     Nose: Nose normal.  Eyes:     Extraocular Movements: Extraocular movements intact.  Cardiovascular:     Pulses: Normal pulses.  Pulmonary:     Effort: Pulmonary effort is normal.  Abdominal:     Palpations: Abdomen is soft.  Musculoskeletal:     Cervical back: Neck supple.  Skin:    General: Skin is warm.     Capillary Refill: Capillary refill takes less than 2 seconds.  Neurological:     Mental Status: She is alert. Mental status is at baseline.  Psychiatric:        Behavior: Behavior normal.  Thought Content: Thought content normal.        Judgment: Judgment normal.     Ortho Exam  Specialty Comments:  No specialty comments available.  Imaging: XR HIP UNILAT W OR W/O PELVIS 1V RIGHT Result Date: 04/02/2024 Xrays show mild hip osteoarthritis with slight joint space narrowing.    PMFS History: Patient Active Problem List   Diagnosis Date Noted   Hyperlipidemia 06/22/2009   GERD 06/22/2009   History of cardiovascular disorder 06/22/2009   SVT (supraventricular tachycardia) (HCC) 06/22/2009   Past Medical History:  Diagnosis Date   Allergy    Anxiety    Bunion    Collagenous  colitis 03/2006   GERD (gastroesophageal reflux disease)    Hyperlipidemia    Osteoporosis 10/10   Personal history of colonic polyps 03/2006   5 mm rectal adenoma   Seasonal allergies    Shingles    Supraventricular premature beats     Family History  Problem Relation Age of Onset   Lung cancer Father        died at age 2, was a smoker   COPD Father    Atrial fibrillation Father    Heart attack Paternal Grandfather    Heart attack Maternal Grandfather    Vascular Disease Mother    Hyperlipidemia Sister    Schizophrenia Sister    Hyperlipidemia Brother    Hyperlipidemia Brother    Heart attack Cousin    Colon cancer Cousin    Colon cancer Maternal Aunt    Diabetes Neg Hx    Esophageal cancer Neg Hx    Rectal cancer Neg Hx    Stomach cancer Neg Hx     Past Surgical History:  Procedure Laterality Date   COLONOSCOPY  03/2006, 07/08/2011   2007 - collagenous colitis, 5 mm rectal adenoma 2012 - normal   FACIAL COSMETIC SURGERY  2012   WISDOM TOOTH EXTRACTION  age 6   Social History   Occupational History   Occupation: retired  Tobacco Use   Smoking status: Former    Current packs/day: 0.00    Average packs/day: 0.5 packs/day for 5.0 years (2.5 ttl pk-yrs)    Types: Cigarettes    Start date: 08/29/1964    Quit date: 08/29/1969    Years since quitting: 54.6   Smokeless tobacco: Never  Vaping Use   Vaping status: Never Used  Substance and Sexual Activity   Alcohol  use: Yes    Alcohol /week: 7.0 standard drinks of alcohol     Types: 7 Glasses of wine per week   Drug use: No   Sexual activity: Not Currently    Partners: Male    Birth control/protection: Post-menopausal

## 2024-04-02 NOTE — Progress Notes (Signed)
   Procedure Note  Patient: [name]             Date of Birth: 05-29-1946           MRN: [id number]             Visit Date: 04/02/2024  Procedures: Visit Diagnoses:  1. Pain in right hip    Large Joint Inj: R hip joint on 04/02/2024 4:03 PM Indications: pain Details: 22 G 3.5 in needle, ultrasound-guided anterior approach Medications: 4 mL lidocaine  1 %; 80 mg methylPREDNISolone  acetate 40 MG/ML Outcome: tolerated well, no immediate complications  Procedure: US -guided intra-articular hip injection, Right After discussion on risks/benefits/indications and informed verbal consent was obtained, a timeout was performed. Patient was lying supine on exam table. The hip was cleaned with betadine and alcohol  swabs. Then utilizing ultrasound guidance, the patient's femoral head and neck junction was identified and subsequently injected with 4:2 lidocaine :depomedrol via an in-plane approach with ultrasound visualization of the injectate administered into the hip joint. Patient tolerated procedure well without immediate complications.  Procedure, treatment alternatives, risks and benefits explained, specific risks discussed. Consent was given by the patient. Immediately prior to procedure a time out was called to verify the correct patient, procedure, equipment, support staff and site/side marked as required. Patient was prepped and draped in the usual sterile fashion.     - patient tolerated procedure well, discussed post-injection protocol - follow-up with Dr. Jerri as indicated based on improvement from injection (discussed 2-week checkin); I am happy to see them as needed  Lonell Sprang, DO Primary Care Sports Medicine Physician  [facility]  This note was dictated using Dragon naturally speaking software and may contain errors in syntax, spelling, or content which have not been identified prior to signing this note.

## 2024-04-02 NOTE — Patient Instructions (Addendum)
 [name],  Pleasure meeting you today. As we discussed during the visit, we did perform a right hip injection today. You may use ice/heat, tylenol  as needed in the next few days if sore from the injection.  At the 2-week mark from the injection: - if you are feeling well, you may continue as is - if at 2 weeks you are still having pain in the leg/hip (injection was not super helpful), that is when you will reach out to [name] via Mychart or phone call to see what he would like you to do next in terms of next steps  Best, Dr. Burnetta

## 2024-04-18 DIAGNOSIS — Z1231 Encounter for screening mammogram for malignant neoplasm of breast: Secondary | ICD-10-CM | POA: Diagnosis not present

## 2024-05-08 DIAGNOSIS — H2513 Age-related nuclear cataract, bilateral: Secondary | ICD-10-CM | POA: Diagnosis not present

## 2024-05-08 DIAGNOSIS — H04123 Dry eye syndrome of bilateral lacrimal glands: Secondary | ICD-10-CM | POA: Diagnosis not present

## 2024-05-08 DIAGNOSIS — H31002 Unspecified chorioretinal scars, left eye: Secondary | ICD-10-CM | POA: Diagnosis not present

## 2024-05-08 DIAGNOSIS — H52203 Unspecified astigmatism, bilateral: Secondary | ICD-10-CM | POA: Diagnosis not present

## 2024-05-10 ENCOUNTER — Encounter: Payer: Self-pay | Admitting: Cardiology

## 2024-05-24 ENCOUNTER — Other Ambulatory Visit (HOSPITAL_COMMUNITY): Payer: Self-pay | Admitting: Internal Medicine

## 2024-05-24 DIAGNOSIS — M810 Age-related osteoporosis without current pathological fracture: Secondary | ICD-10-CM | POA: Insufficient documentation

## 2024-05-27 ENCOUNTER — Telehealth (HOSPITAL_COMMUNITY): Payer: Self-pay | Admitting: Pharmacy Technician

## 2024-05-27 NOTE — Telephone Encounter (Signed)
 Auth Submission: APPROVED Site of care: [facility] Payer: [employer] MEDICARE Medication & CPT/J Code(s) submitted: Prolia  (Denosumab ) [id number] Diagnosis Code: M81.0 Route of submission (phone, fax, portal):  Phone # Fax # Auth type: Buy/Bill HB Units/visits requested: 60mg  x 2 doses, q 6 months Reference number: [id number]  Approval from: 12/11/23 to 12/10/24    Dagoberto Armour, CPhT [facility] Phone: [phone] 05/27/2024

## 2024-05-28 DIAGNOSIS — M722 Plantar fascial fibromatosis: Secondary | ICD-10-CM | POA: Diagnosis not present

## 2024-05-28 DIAGNOSIS — I2510 Atherosclerotic heart disease of native coronary artery without angina pectoris: Secondary | ICD-10-CM | POA: Diagnosis not present

## 2024-05-28 DIAGNOSIS — M810 Age-related osteoporosis without current pathological fracture: Secondary | ICD-10-CM | POA: Diagnosis not present

## 2024-05-28 DIAGNOSIS — Z1152 Encounter for screening for COVID-19: Secondary | ICD-10-CM | POA: Diagnosis not present

## 2024-05-28 DIAGNOSIS — K219 Gastro-esophageal reflux disease without esophagitis: Secondary | ICD-10-CM | POA: Diagnosis not present

## 2024-05-28 DIAGNOSIS — Z23 Encounter for immunization: Secondary | ICD-10-CM | POA: Diagnosis not present

## 2024-05-28 DIAGNOSIS — E785 Hyperlipidemia, unspecified: Secondary | ICD-10-CM | POA: Diagnosis not present

## 2024-05-28 DIAGNOSIS — M25551 Pain in right hip: Secondary | ICD-10-CM | POA: Diagnosis not present

## 2024-05-28 DIAGNOSIS — I493 Ventricular premature depolarization: Secondary | ICD-10-CM | POA: Diagnosis not present

## 2024-05-28 DIAGNOSIS — B009 Herpesviral infection, unspecified: Secondary | ICD-10-CM | POA: Diagnosis not present

## 2024-05-28 DIAGNOSIS — F419 Anxiety disorder, unspecified: Secondary | ICD-10-CM | POA: Diagnosis not present

## 2024-05-28 DIAGNOSIS — G5621 Lesion of ulnar nerve, right upper limb: Secondary | ICD-10-CM | POA: Diagnosis not present

## 2024-06-15 ENCOUNTER — Other Ambulatory Visit: Payer: Self-pay | Admitting: Cardiology

## 2024-06-20 DIAGNOSIS — K219 Gastro-esophageal reflux disease without esophagitis: Secondary | ICD-10-CM | POA: Diagnosis not present

## 2024-06-20 DIAGNOSIS — D104 Benign neoplasm of tonsil: Secondary | ICD-10-CM | POA: Diagnosis not present

## 2024-07-01 ENCOUNTER — Encounter: Payer: Self-pay | Admitting: Radiology

## 2024-07-02 ENCOUNTER — Ambulatory Visit (HOSPITAL_COMMUNITY)
Admission: RE | Admit: 2024-07-02 | Discharge: 2024-07-02 | Disposition: A | Discharge status: Home / Self Care | Source: Ambulatory Visit | Attending: Internal Medicine | Admitting: Internal Medicine

## 2024-07-02 VITALS — BP 116/62 | HR 71 | Temp 98.0°F | Resp 17

## 2024-07-02 DIAGNOSIS — M810 Age-related osteoporosis without current pathological fracture: Secondary | ICD-10-CM | POA: Insufficient documentation

## 2024-07-02 MED ORDER — DENOSUMAB 60 MG/ML ~~LOC~~ SOSY
PREFILLED_SYRINGE | SUBCUTANEOUS | Status: AC
  Filled 2024-07-02: qty 1

## 2024-07-02 MED ORDER — DENOSUMAB 60 MG/ML ~~LOC~~ SOSY
60.0000 mg | PREFILLED_SYRINGE | Freq: Once | SUBCUTANEOUS | Status: AC
  Administered 2024-07-02: 60 mg via SUBCUTANEOUS

## 2024-07-03 ENCOUNTER — Inpatient Hospital Stay (HOSPITAL_COMMUNITY): Admission: RE | Admit: 2024-07-03 | Source: Ambulatory Visit

## 2024-07-15 ENCOUNTER — Other Ambulatory Visit: Payer: Self-pay | Admitting: Cardiology

## 2024-09-07 ENCOUNTER — Other Ambulatory Visit: Payer: Self-pay | Admitting: Cardiology

## 2024-09-25 ENCOUNTER — Telehealth: Payer: Self-pay | Admitting: Cardiology

## 2024-09-30 NOTE — Telephone Encounter (Signed)
 Pt scheduled to see Lum Louis, NP, 11/20/24, refill sent.

## 2024-11-20 ENCOUNTER — Ambulatory Visit: Admitting: Emergency Medicine

## 2025-01-01 ENCOUNTER — Inpatient Hospital Stay (HOSPITAL_COMMUNITY): Admission: RE | Admit: 2025-01-01 | Source: Ambulatory Visit
